# Patient Record
Sex: Female | Born: 1995 | Race: Black or African American | Hispanic: No | Marital: Married | Smoking: Former smoker
Health system: Southern US, Community
[De-identification: ages and names within clinical notes are randomized; demographics above are authoritative.]

## PROBLEM LIST (undated history)

## (undated) ENCOUNTER — Inpatient Hospital Stay: Payer: Self-pay

## (undated) DIAGNOSIS — Z789 Other specified health status: Secondary | ICD-10-CM

---
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
## 2014-09-26 NOTE — L&D Delivery Note (Signed)
Deliver Note   Date of Delivery:   02/20/2015 Primary OB:   [facility] Gestational Age/EDD: 8223w3d by 03/03/2015, by Last Menstrual Period  Antepartum complications:  OB History    Gravida Para Term Preterm AB TAB SAB Ectopic Multiple Living   4 2 2   0 0 0 0 0 2      Obstetric Comments   [facility] as [facility] by patient.  Patient states that [facility] but has not been there x 1 month or more.  [place] and [facility].      Delivered By:   Vena AustriaStaebler, Edwin Cherian MD  Delivery Type:  NSVD   Anesthesia: none      Intrapartum complications:  GBS:     Unknown Laceration:     none Episiotomy:    none Placenta:    Spontaneous Estimated Blood Loss:  500mL Baby:    Liveborn female , APGAR (1 MIN):8 APGAR (5 MINS):  9 APGAR (10 MINS):  , weight pending   Deliver Details   At  a female was delivered via  (Presentation: OA  ).  APGAR:8 ,9 ; weight pending.   Placenta status: delivered spontaneously and intact , 3 vessel Cord:  with the following complications: none with delivery, precipitous delivery no/limited prenatal care    Mom to postpartum.  Baby to Nursery.

## 2015-02-11 ENCOUNTER — Observation Stay
Admission: EM | Admit: 2015-02-11 | Discharge: 2015-02-11 | Disposition: A | Discharge status: Home / Self Care | Payer: Self-pay | Attending: Obstetrics & Gynecology | Admitting: Obstetrics & Gynecology

## 2015-02-11 ENCOUNTER — Encounter: Payer: Self-pay | Admitting: *Deleted

## 2015-02-11 ENCOUNTER — Observation Stay: Payer: Self-pay

## 2015-02-11 DIAGNOSIS — R109 Unspecified abdominal pain: Secondary | ICD-10-CM | POA: Insufficient documentation

## 2015-02-11 DIAGNOSIS — Z3A37 37 weeks gestation of pregnancy: Secondary | ICD-10-CM | POA: Insufficient documentation

## 2015-02-11 DIAGNOSIS — O26893 Other specified pregnancy related conditions, third trimester: Principal | ICD-10-CM | POA: Insufficient documentation

## 2015-02-11 DIAGNOSIS — F1910 Other psychoactive substance abuse, uncomplicated: Secondary | ICD-10-CM

## 2015-02-11 DIAGNOSIS — O0933 Supervision of pregnancy with insufficient antenatal care, third trimester: Secondary | ICD-10-CM

## 2015-02-11 DIAGNOSIS — O0930 Supervision of pregnancy with insufficient antenatal care, unspecified trimester: Secondary | ICD-10-CM

## 2015-02-11 HISTORY — DX: Other psychoactive substance abuse, uncomplicated: F19.10

## 2015-02-11 HISTORY — DX: Other specified health status: Z78.9

## 2015-02-11 LAB — DIFFERENTIAL
BASOS ABS: 0 10*3/uL (ref 0–0.1)
Basophils Relative: 0 %
Eosinophils Absolute: 0.3 10*3/uL (ref 0–0.7)
Eosinophils Relative: 3 %
LYMPHS PCT: 24 %
Lymphs Abs: 2.2 10*3/uL (ref 1.0–3.6)
Monocytes Absolute: 1.1 10*3/uL — ABNORMAL HIGH (ref 0.2–0.9)
Monocytes Relative: 12 %
NEUTROS ABS: 5.7 10*3/uL (ref 1.4–6.5)
Neutrophils Relative %: 61 %

## 2015-02-11 LAB — RAPID HIV SCREEN (HIV 1/2 AB+AG)
HIV 1/2 Antibodies: NONREACTIVE
HIV-1 P24 Antigen - HIV24: NONREACTIVE

## 2015-02-11 LAB — CBC
HEMATOCRIT: 29.2 % — AB (ref 35.0–47.0)
Hemoglobin: 9.7 g/dL — ABNORMAL LOW (ref 12.0–16.0)
MCH: 28.4 pg (ref 26.0–34.0)
MCHC: 33 g/dL (ref 32.0–36.0)
MCV: 86.1 fL (ref 80.0–100.0)
PLATELETS: 203 10*3/uL (ref 150–440)
RBC: 3.4 MIL/uL — AB (ref 3.80–5.20)
RDW: 13.3 % (ref 11.5–14.5)
WBC: 9.4 10*3/uL (ref 3.6–11.0)

## 2015-02-11 LAB — URINALYSIS COMPLETE WITH MICROSCOPIC (ARMC ONLY)
Bilirubin Urine: NEGATIVE
Glucose, UA: NEGATIVE mg/dL
HGB URINE DIPSTICK: NEGATIVE
Ketones, ur: NEGATIVE mg/dL
Nitrite: NEGATIVE
PH: 7 (ref 5.0–8.0)
PROTEIN: NEGATIVE mg/dL
Specific Gravity, Urine: 1.005 (ref 1.005–1.030)

## 2015-02-11 LAB — URINE DRUG SCREEN, QUALITATIVE (ARMC ONLY)
Amphetamines, Ur Screen: NOT DETECTED
Barbiturates, Ur Screen: NOT DETECTED
Benzodiazepine, Ur Scrn: POSITIVE — AB
Cannabinoid 50 Ng, Ur ~~LOC~~: POSITIVE — AB
Cocaine Metabolite,Ur ~~LOC~~: POSITIVE — AB
MDMA (ECSTASY) UR SCREEN: NOT DETECTED
Methadone Scn, Ur: NOT DETECTED
Opiate, Ur Screen: NOT DETECTED
Phencyclidine (PCP) Ur S: NOT DETECTED
Tricyclic, Ur Screen: NOT DETECTED

## 2015-02-11 LAB — TYPE AND SCREEN
ABO/RH(D): A POS
Antibody Screen: NEGATIVE

## 2015-02-11 LAB — ABO/RH: ABO/RH(D): A POS

## 2015-02-11 MED ORDER — NITROFURANTOIN MACROCRYSTAL 100 MG PO CAPS
ORAL_CAPSULE | ORAL | Status: AC
  Filled 2015-02-11: qty 1

## 2015-02-11 MED ORDER — NITROFURANTOIN MONOHYD MACRO 100 MG PO CAPS
100.0000 mg | ORAL_CAPSULE | Freq: Two times a day (BID) | ORAL | Status: DC
  Administered 2015-02-11: 100 mg via ORAL
  Filled 2015-02-11 (×2): qty 1

## 2015-02-11 MED ORDER — NITROFURANTOIN MONOHYD MACRO 100 MG PO CAPS: 100.0000 mg | ORAL_CAPSULE | Freq: Two times a day (BID) | ORAL | Status: AC

## 2015-02-11 NOTE — Progress Notes (Signed)
Entered pt's room. Pt sleeping. Midwife courtney in to ask pt regarding prenatal care. Pt vague with answers. Called [facility]. States pt was only seen in triage x 1. No ultrasound report available. Orders for prenatal labs  And ultrasound entered by courtney.

## 2015-02-11 NOTE — OB Triage Note (Signed)
[facility] with c/o abd pain and pressure into groin and legs x 2 days.  Denies bleeding or leaking of fluid.  States [place] in [place] but has not been back in some time.  States she has recently moved from [place] and is planning to initiate care locally but does not know where.

## 2015-02-11 NOTE — H&P (Signed)
Obstetric History and Physical  Colleen Mccarthy is a 19 y.o. [id number] with IUP at 2035w1d presenting for abdominal pain that started last night. Patient states she has been having  Irregular contractions, none vaginal bleeding, intact membranes, with active fetal movement.    Prenatal Course Source of Care: pt [facility] ( no records found when contacting agencies- only triage visits)   Pt has had limited prenatal care- no records available. Pt reports having an us performed "by a doctor with the small machine" but has not had any formal scans or labs drawn. Pt denies any substance abuse or use. Pt denies any problems during her pregnancy. She is overall a poor historian and cannot remember her due date. [facility] was called and the patient was found to have a LMP of 05/28/15 with an EDC of 03/03/15.   Pregnancy complications or risks: Patient Active Problem List   Diagnosis Date Noted  . Labor and delivery, indication for care 02/11/2015  . Drug abuse 02/11/2015  . Insufficient prenatal care in third trimester 02/11/2015    Prenatal labs and studies:  None drawn  ABO, Rh:   Antibody:   Rubella:   Varicella: unknown RPR:    HBsAg:    HIV:    GBS:  1 hr Glucola  Not performed- no care Genetic screening NA Anatomy US NA- not performed.  Tdap: unknown  Flu: unknown   Past Medical History  Diagnosis Date  . Medical history non-contributory     History reviewed. No pertinent past surgical history.  OB History  Gravida Para Term Preterm AB SAB TAB Ectopic Multiple Living  4 2 2   0 0 0 0 0 2    # Outcome Date GA Lbr Len/2nd Weight Sex Delivery Anes PTL Lv  4 Current           3 Gravida           2 Term           1 Term             [facility].  Patient states [facility] but has not been there x 1 month or more.  [place] and [facility]  yet.    History   Social History  . Marital Status: Single    Spouse Name: N/A  . Number of Children: N/A  . Years of Education: N/A   Social History Main Topics  . Smoking status: Current Every Day Smoker    Types: Cigarettes  . Smokeless tobacco: Not on file  . Alcohol Use: No     Comment: pt denies but has a +UDS  . Drug Use: Yes    Special: Cocaine, Benzodiazepines, Marijuana  . Sexual Activity: Yes    Birth Control/ Protection: None   Other Topics Concern  . None   Social History Narrative  . None    Family History  Problem Relation Age of Onset  . Cancer Paternal Grandmother     Prescriptions prior to admission  Medication Sig Dispense Refill Last Dose  . Prenatal Vit-Fe Fumarate-FA (MULTIVITAMIN-PRENATAL) 27-0.8 MG TABS tablet Take 1 tablet by mouth daily at 12 noon.   02/10/2015 at Unknown time    Allergies  Allergen Reactions  . Vicodin [Hydrocodone-Acetaminophen]     Patient describes seizure like activity with administration.  Happened  7 years ago    Review of Systems: Negative except for what is mentioned in HPI.  Physical Exam: BP 106/64 mmHg  Pulse 72  Temp(Src) 97.8 F (36.6 C) (Oral)  Resp 20  Ht  (1.702 m)  Wt 65.772 kg (145 lb)  BMI 22.71 kg/m2  LMP 05/27/2014 (Within Days) GENERAL: Well-developed, well-nourished female in no acute distress.  LUNGS: Clear to auscultation bilaterally.  HEART: Regular rate and rhythm. ABDOMEN: Soft, nontender, nondistended, gravid. EXTREMITIES: Nontender, no edema, 2+ distal pulses.   Presentation: cephalic FHT:  Baseline rate 125-130s bpm   Variability moderate  Accelerations present   Decelerations none Contractions: Every 10-12 mins  Pertinent Labs/Studies:   Results for orders placed or performed during the hospital encounter of 02/11/15 (from the past 24 hour(s))  Urinalysis complete, with microscopic [facility])     Status: Abnormal   Collection Time: 02/11/15  5:12 AM  Result Value Ref Range    Color, Urine STRAW (A) YELLOW   APPearance CLEAR (A) CLEAR   Glucose, UA NEGATIVE NEGATIVE mg/dL   Bilirubin Urine NEGATIVE NEGATIVE   Ketones, ur NEGATIVE NEGATIVE mg/dL   Specific Gravity, Urine 1.005 1.005 - 1.030   Hgb urine dipstick NEGATIVE NEGATIVE   pH 7.0 5.0 - 8.0   Protein, ur NEGATIVE NEGATIVE mg/dL   Nitrite NEGATIVE NEGATIVE   Leukocytes, UA 2+ (A) NEGATIVE   RBC / HPF 0-5 0 - 5 RBC/hpf   WBC, UA 6-30 0 - 5 WBC/hpf   Bacteria, UA RARE (A) NONE SEEN   Squamous Epithelial / LPF 0-5 (A) NONE SEEN  Urine Drug Screen, Qualitative [facility])     Status: Abnormal   Collection Time: 02/11/15  5:12 AM  Result Value Ref Range   Tricyclic, Ur Screen NONE DETECTED NONE DETECTED   Amphetamines, Ur Screen NONE DETECTED NONE DETECTED   MDMA (Ecstasy)Ur Screen NONE DETECTED NONE DETECTED   Cocaine Metabolite,Ur [place] POSITIVE (A) NONE DETECTED   Opiate, Ur Screen NONE DETECTED NONE DETECTED   Phencyclidine (PCP) Ur S NONE DETECTED NONE DETECTED   Cannabinoid 50 Ng, Ur [place] POSITIVE (A) NONE DETECTED   Barbiturates, Ur Screen NONE DETECTED NONE DETECTED   Benzodiazepine, Ur Scrn POSITIVE (A) NONE DETECTED   Methadone Scn, Ur NONE DETECTED NONE DETECTED   OB History    Gravida Para Term Preterm AB TAB SAB Ectopic Multiple Living   0 0 0 0 0 2      Obstetric Comments   [facility] March 11, 2015 as stated by patient.  Patient states that she was being seen at [facility] but has not been there x 1 month or more.  Patient has recently moved to [facility] and has not set up [facility] yet.     OB limited scan:  CLINICAL DATA: No prenatal care. Pain for 3 days.  EXAM: LIMITED OBSTETRIC ULTRASOUND  FINDINGS: Number of Fetuses: 1 Heart Rate: 140 bpm Movement: Yes Presentation: Cephalic Placental Location: Fundal, posterior Previa: No Amniotic Fluid (Subjective): Within normal limits.  AFI: 10.5 cm BPD: 8.74cm 35w 2d MATERNAL FINDINGS: Cervix: Not visualize. Uterus/Adnexae:  No abnormality visualized.  IMPRESSION: 1. Single living intrauterine gestation. The estimated gestational age by today's exam is 35 weeks and 2 days. The clinical gestational age is 37 weeks and 1 day.  This exam is performed on an emergent basis and does not comprehensively evaluate fetal size, dating, or anatomy; follow-up complete OB US should be considered if further fetal assessment is warranted.  Assessment : Colleen Mccarthy is a 19 y.o. [phone] at 7232w1d being observed for abdominal pain UTI No prenatal care +UDS  Cat 1 FHT   Plan: Treat UTI- rx for macrobid when pt goes home US for position, AFI, dates Prenatal labs including GBS to be drawn  Discharge home with labor precautions   Jannet Mantisourtney Subudhi, CNM [facility]  Cervix on exam by L&D staff/CNM Subudhi was closed. Ranae Plumberhelsea Serra Younan, MD

## 2015-02-11 NOTE — Plan of Care (Signed)
[facility]

## 2015-02-12 LAB — RPR: RPR: NONREACTIVE

## 2015-02-12 LAB — VARICELLA ZOSTER ANTIBODY, IGG

## 2015-02-12 LAB — RUBELLA SCREEN: Rubella: 2.13 index (ref >0.99)

## 2015-02-13 LAB — CULTURE, BETA STREP (GROUP B ONLY)

## 2015-02-13 LAB — MISC LABCORP TEST (SEND OUT): Labcorp test code: 6510

## 2015-02-15 LAB — CHLAMYDIA/NGC RT PCR (ARMC ONLY)
Chlamydia Tr: DETECTED
N gonorrhoeae: NOT DETECTED

## 2015-02-20 ENCOUNTER — Inpatient Hospital Stay
Admission: EM | Admit: 2015-02-20 | Discharge: 2015-02-22 | DRG: 775 | Disposition: A | Discharge status: Home / Self Care | Payer: Self-pay | Attending: Obstetrics and Gynecology | Admitting: Obstetrics and Gynecology

## 2015-02-20 DIAGNOSIS — Z3490 Encounter for supervision of normal pregnancy, unspecified, unspecified trimester: Secondary | ICD-10-CM

## 2015-02-20 DIAGNOSIS — F17210 Nicotine dependence, cigarettes, uncomplicated: Secondary | ICD-10-CM | POA: Diagnosis present

## 2015-02-20 DIAGNOSIS — Z3A38 38 weeks gestation of pregnancy: Secondary | ICD-10-CM | POA: Diagnosis present

## 2015-02-20 DIAGNOSIS — O0933 Supervision of pregnancy with insufficient antenatal care, third trimester: Secondary | ICD-10-CM

## 2015-02-20 DIAGNOSIS — O99334 Smoking (tobacco) complicating childbirth: Secondary | ICD-10-CM | POA: Diagnosis present

## 2015-02-20 DIAGNOSIS — Z3A00 Weeks of gestation of pregnancy not specified: Secondary | ICD-10-CM | POA: Diagnosis present

## 2015-02-20 DIAGNOSIS — Z809 Family history of malignant neoplasm, unspecified: Secondary | ICD-10-CM

## 2015-02-20 DIAGNOSIS — F1410 Cocaine abuse, uncomplicated: Secondary | ICD-10-CM | POA: Diagnosis present

## 2015-02-20 DIAGNOSIS — O99324 Drug use complicating childbirth: Principal | ICD-10-CM | POA: Diagnosis present

## 2015-02-20 LAB — CBC
HEMATOCRIT: 34.9 % — AB (ref 35.0–47.0)
Hemoglobin: 11.4 g/dL — ABNORMAL LOW (ref 12.0–16.0)
MCH: 28.5 pg (ref 26.0–34.0)
MCHC: 32.6 g/dL (ref 32.0–36.0)
MCV: 87.4 fL (ref 80.0–100.0)
Platelets: 213 10*3/uL (ref 150–440)
RBC: 3.99 MIL/uL (ref 3.80–5.20)
RDW: 13.5 % (ref 11.5–14.5)
WBC: 12.3 10*3/uL — AB (ref 3.6–11.0)

## 2015-02-20 LAB — URINE DRUG SCREEN, QUALITATIVE (ARMC ONLY)
Amphetamines, Ur Screen: NOT DETECTED
BARBITURATES, UR SCREEN: NOT DETECTED
BENZODIAZEPINE, UR SCRN: NOT DETECTED
COCAINE METABOLITE, UR ~~LOC~~: POSITIVE — AB
Cannabinoid 50 Ng, Ur ~~LOC~~: POSITIVE — AB
MDMA (Ecstasy)Ur Screen: NOT DETECTED
METHADONE SCREEN, URINE: NOT DETECTED
Opiate, Ur Screen: NOT DETECTED
Phencyclidine (PCP) Ur S: NOT DETECTED
Tricyclic, Ur Screen: NOT DETECTED

## 2015-02-20 LAB — TYPE AND SCREEN
ABO/RH(D): A POS
ANTIBODY SCREEN: NEGATIVE

## 2015-02-20 MED ORDER — IBUPROFEN 600 MG PO TABS
600.0000 mg | ORAL_TABLET | Freq: Four times a day (QID) | ORAL | Status: DC
  Administered 2015-02-20 – 2015-02-22 (×8): 600 mg via ORAL
  Filled 2015-02-20 (×6): qty 1

## 2015-02-20 MED ORDER — OXYTOCIN 40 UNITS IN LACTATED RINGERS INFUSION - SIMPLE MED
INTRAVENOUS | Status: AC
  Filled 2015-02-20: qty 1000

## 2015-02-20 MED ORDER — TETANUS-DIPHTH-ACELL PERTUSSIS 5-2.5-18.5 LF-MCG/0.5 IM SUSP
0.5000 mL | Freq: Once | INTRAMUSCULAR | Status: AC
  Administered 2015-02-22: 0.5 mL via INTRAMUSCULAR
  Filled 2015-02-20: qty 0.5

## 2015-02-20 MED ORDER — LANOLIN HYDROUS EX OINT: TOPICAL_OINTMENT | CUTANEOUS | Status: DC | PRN

## 2015-02-20 MED ORDER — SIMETHICONE 80 MG PO CHEW: 80.0000 mg | CHEWABLE_TABLET | ORAL | Status: DC | PRN

## 2015-02-20 MED ORDER — BENZOCAINE-MENTHOL 20-0.5 % EX AERO
1.0000 "application " | INHALATION_SPRAY | CUTANEOUS | Status: DC | PRN
  Administered 2015-02-21: 1 via TOPICAL
  Filled 2015-02-20: qty 56

## 2015-02-20 MED ORDER — ONDANSETRON HCL 4 MG PO TABS: 4.0000 mg | ORAL_TABLET | ORAL | Status: DC | PRN

## 2015-02-20 MED ORDER — AZITHROMYCIN 250 MG PO TABS
1000.0000 mg | ORAL_TABLET | Freq: Once | ORAL | Status: AC
  Administered 2015-02-20: 1000 mg via ORAL
  Filled 2015-02-20: qty 4

## 2015-02-20 MED ORDER — IBUPROFEN 600 MG PO TABS
ORAL_TABLET | ORAL | Status: AC
  Administered 2015-02-20: 600 mg via ORAL
  Filled 2015-02-20: qty 1

## 2015-02-20 MED ORDER — LIDOCAINE HCL (PF) 1 % IJ SOLN
30.0000 mL | INTRAMUSCULAR | Status: DC | PRN
  Filled 2015-02-20: qty 30

## 2015-02-20 MED ORDER — ONDANSETRON HCL 4 MG/2ML IJ SOLN: 4.0000 mg | INTRAMUSCULAR | Status: DC | PRN

## 2015-02-20 MED ORDER — LACTATED RINGERS IV SOLN: 500.0000 mL | INTRAVENOUS | Status: DC | PRN

## 2015-02-20 MED ORDER — SENNOSIDES-DOCUSATE SODIUM 8.6-50 MG PO TABS
2.0000 | ORAL_TABLET | ORAL | Status: DC
  Administered 2015-02-21: 2 via ORAL
  Filled 2015-02-20 (×2): qty 2

## 2015-02-20 MED ORDER — OXYTOCIN 40 UNITS IN LACTATED RINGERS INFUSION - SIMPLE MED: 62.5000 mL/h | INTRAVENOUS | Status: DC

## 2015-02-20 MED ORDER — PRENATAL MULTIVITAMIN CH
1.0000 | ORAL_TABLET | Freq: Every day | ORAL | Status: DC
  Administered 2015-02-21 – 2015-02-22 (×2): 1 via ORAL
  Filled 2015-02-20 (×2): qty 1

## 2015-02-20 MED ORDER — DIPHENHYDRAMINE HCL 25 MG PO CAPS
25.0000 mg | ORAL_CAPSULE | Freq: Four times a day (QID) | ORAL | Status: DC | PRN
  Administered 2015-02-21: 25 mg via ORAL
  Filled 2015-02-20: qty 1

## 2015-02-20 MED ORDER — LACTATED RINGERS IV SOLN: INTRAVENOUS | Status: DC

## 2015-02-20 MED ORDER — OXYTOCIN BOLUS FROM INFUSION
500.0000 mL | INTRAVENOUS | Status: DC
  Administered 2015-02-20: 500 mL via INTRAVENOUS

## 2015-02-20 MED ORDER — DIBUCAINE 1 % RE OINT: 1.0000 "application " | TOPICAL_OINTMENT | RECTAL | Status: DC | PRN

## 2015-02-20 MED ORDER — WITCH HAZEL-GLYCERIN EX PADS: 1.0000 "application " | MEDICATED_PAD | CUTANEOUS | Status: DC | PRN

## 2015-02-20 NOTE — H&P (Addendum)
Obstetric History and Physical  Colleen Mccarthy is a 19 y.o. [id number] with IUP at [redacted]w[redacted]d presenting via EMS for contractions starting yesterday evening.  Precipitously delivered shortly after arrival.    Prenatal Course Source of Care: pt states [facility] is with [facility] ( no records found when contacting agencies- only triage visits)   Pt has had limited prenatal care- no records available. Pt reports having an US performed "by a doctor with the small machine" but has not had any formal scans or labs drawn. Pt denies any substance abuse or use. Pt denies any problems during her pregnancy. She is overall a poor historian and cannot remember her due date. [facility] was called and the patient was found to have a LMP of 05/28/15 with an EDC of 03/03/15.   Pregnancy complications or risks: Patient Active Problem List   Diagnosis Date Noted  . Pregnant 02/20/2015  . Labor and delivery, indication for care 02/11/2015  . Drug abuse 02/11/2015  . Insufficient prenatal care in third trimester 02/11/2015    Prenatal labs and studies:  None drawn  ABO, Rh: --/--/A POS, A POS (05/18 1040) Antibody: NEG (05/18 1040) Rubella: 2.13 (05/18 1040) Varicella: unknown RPR: Non Reactive (05/18 1040)  HBsAg:    HIV:    GBS:  1 hr Glucola  Not performed- no care Genetic screening NA Anatomy US NA- not performed.  Tdap: unknown  Flu: unknown   Past Medical History  Diagnosis Date  . Medical history non-contributory     No past surgical history on file.  OB History  Gravida Para Term Preterm AB SAB TAB Ectopic Multiple Living  0 0 0 0 0 2    # Outcome Date GA Lbr Len/2nd Weight Sex Delivery Anes PTL Lv  4 Current           3 Gravida           2 Term           1 Term             Obstetric Comments  [facility] March 11, 2015 as stated by patient.  Patient states that she was being seen at [facility] but has not been there x 1 month or more.  Patient has recently moved to [facility] and has  not set up [facility] yet.    History   Social History  . Marital Status: Single    Spouse Name: N/A  . Number of Children: N/A  . Years of Education: N/A   Social History Main Topics  . Smoking status: Current Every Day Smoker    Types: Cigarettes  . Smokeless tobacco: Not on file  . Alcohol Use: No     Comment: pt denies but has a +UDS  . Drug Use: Yes    Special: Cocaine, Benzodiazepines, Marijuana  . Sexual Activity: Yes    Birth Control/ Protection: None   Other Topics Concern  . Not on file   Social History Narrative  . No narrative on file    Family History  Problem Relation Age of Onset  . Cancer Paternal Grandmother     Prescriptions prior to admission  Medication Sig Dispense Refill Last Dose  . Prenatal Vit-Fe Fumarate-FA (MULTIVITAMIN-PRENATAL) 27-0.8 MG TABS tablet Take 1 tablet by mouth daily at 12 noon.   02/10/2015 at Unknown time    Allergies  Allergen Reactions  . Vicodin [Hydrocodone-Acetaminophen]     Patient describes seizure like activity  with administration.  Happened 7 years ago    Review of Systems: Negative except for what is mentioned in HPI.  Physical Exam: LMP 05/27/2014 (Within Days) GENERAL: Well-developed, well-nourished female in no acute distress.  LUNGS: Clear to auscultation bilaterally.  HEART: Regular rate and rhythm. ABDOMEN: Soft, nontender, nondistended, gravid. Cervix 8/C/-2 on presentation, AROM clear fluid EXTREMITIES: Nontender, no edema, 2+ distal pulses.   Presentation: cephalic (OA) FHT:  Baseline rate 140s bpm   Variability moderate  Accelerations present   Decelerations none Contractions: Every 2-3 min  Pertinent Labs/Studies:   No results found for this or any previous visit (from the past 24 hour(s)). OB History    Gravida Para Term Preterm AB TAB SAB Ectopic Multiple Living   4 2 2   0 0 0 0 0 2      Obstetric Comments   [facility].  Patient states [facility]  but has not been there x 1 month or more.  [place] and [facility].     Assessment : Colleen Mccarthy is a 19 y.o. [phone] at 8933w1d being observed for abdominal pain  Plan: - admit for postpartum care - UDS (cocaine use on 5/18) - GBS culture - Chlamydia positive previously will verify treated  GBS was positive 5/18, also Chlamydia positive 5/18 not treated will give azithromycin now, peds aware

## 2015-02-20 NOTE — Progress Notes (Signed)
Dr Vergie Livingpickens given update- [facility], and for RN to observe BP.

## 2015-02-20 NOTE — Progress Notes (Signed)
Reflexes 3+, 0 clonus. Pt reports nil h/a or visual disturbances. Edema 1+ bipedally only.

## 2015-02-20 NOTE — Progress Notes (Signed)
Patient arrived via EMS stating she needed to push. Patient is out of control screaming. Pt has had little PNC and states she has used drugs this pregnancy. Patient denies any problems this pregnancy  Or medical history.

## 2015-02-21 LAB — RPR: RPR Ser Ql: NONREACTIVE

## 2015-02-21 LAB — CBC
HCT: 27.6 % — ABNORMAL LOW (ref 35.0–47.0)
HEMOGLOBIN: 9.2 g/dL — AB (ref 12.0–16.0)
MCH: 29 pg (ref 26.0–34.0)
MCHC: 33.5 g/dL (ref 32.0–36.0)
MCV: 86.7 fL (ref 80.0–100.0)
Platelets: 189 10*3/uL (ref 150–440)
RBC: 3.18 MIL/uL — ABNORMAL LOW (ref 3.80–5.20)
RDW: 13.4 % (ref 11.5–14.5)
WBC: 13.1 10*3/uL — AB (ref 3.6–11.0)

## 2015-02-21 MED ORDER — VARICELLA VIRUS VACCINE LIVE 1350 PFU/0.5ML IJ SUSR
0.5000 mL | Freq: Once | INTRAMUSCULAR | Status: AC
  Administered 2015-02-22: 0.5 mL via SUBCUTANEOUS
  Filled 2015-02-21: qty 0.5

## 2015-02-21 MED ORDER — OXYCODONE-ACETAMINOPHEN 5-325 MG PO TABS
1.0000 | ORAL_TABLET | ORAL | Status: DC | PRN
  Administered 2015-02-21: 1 via ORAL
  Administered 2015-02-22 (×5): 2 via ORAL
  Filled 2015-02-21: qty 1
  Filled 2015-02-21 (×5): qty 2

## 2015-02-21 NOTE — Progress Notes (Signed)
Post Partum Day 1 Subjective: no complaints  Objective: Blood pressure 108/74, pulse 83, temperature 97.9 F (36.6 C), temperature source Oral, resp. rate 16, height 5\' 7"  (1.702 m), weight 65.772 kg (145 lb), last menstrual period 05/27/2014, SpO2 100 %, unknown if currently breastfeeding.  Physical Exam:  General: alert, cooperative and appears stated age Lochia: appropriate Uterine Fundus: firm Incision: none DVT Evaluation: No evidence of DVT seen on physical exam.   Recent Labs  02/20/15 1557 02/21/15 0448  HGB 11.4* 9.2*  HCT 34.9* 27.6*    Assessment/Plan: Plan for discharge tomorrow  Bottle feeding Needs Varicella    LOS: 1 day   Webb Weed PAUL 02/21/2015, 2:21 PM

## 2015-02-22 MED ORDER — MEDROXYPROGESTERONE ACETATE 150 MG/ML IM SUSP
150.0000 mg | Freq: Once | INTRAMUSCULAR | Status: AC
  Administered 2015-02-22: 150 mg via INTRAMUSCULAR
  Filled 2015-02-22: qty 1

## 2015-02-22 MED ORDER — IBUPROFEN 600 MG PO TABS: 600.0000 mg | ORAL_TABLET | Freq: Four times a day (QID) | ORAL | Status: DC

## 2015-02-22 MED ORDER — MEDROXYPROGESTERONE ACETATE 150 MG/ML IM SUSP: 150.0000 mg | Freq: Once | INTRAMUSCULAR | Status: DC

## 2015-02-22 NOTE — Discharge Summary (Signed)
  Obstetrical Discharge Summary  Date of Admission: 02/20/2015 Date of Discharge: 02/22/15  Primary OB:  other facility   Gestational Age at Delivery: 59110w3d  Antepartum complications: none Date of Delivery: 02/20/15   Delivered By: Dr Bonney AidStaebler Delivery Type: spontaneous vaginal delivery Intrapartum complications/course: Limited Prenatal Care, Drug Use Anesthesia: none Placenta: spontaneous Laceration: n/a and none Episiotomy: none Baby: Liveborn female  Post partum course: Since the delivery, patient has tolerate activity, diet, and daily functions without difficulty or complication.  Min lochia.  No breast concerns at this time.  No signs of depression currently.   Disposition: home with infant Rh Immune globulin given: no Rubella vaccine given: no Varicella vaccine given: yes Tdap vaccine given in AP or PP setting: yes Flu vaccine given in AP or PP setting: not applicable Contraception: Depo-Provera  Prenatal Labs: A POS//plans to bottle feed//female  Plan:  Colleen Mccarthy was discharged to home in good condition. [facility] in 6 weeks  Discharge Medications:   Medication List    TAKE these medications        ibuprofen 600 MG tablet  Commonly known as:  ADVIL,MOTRIN  Take 1 tablet (600 mg total) by mouth every 6 (six) hours.     multivitamin-prenatal 27-0.8 MG Tabs tablet  Take 1 tablet by mouth daily at 12 noon.

## 2015-02-22 NOTE — Discharge Instructions (Signed)
Postpartum Care After Vaginal Delivery °After you deliver your newborn (postpartum period), the usual stay in the hospital is 24-72 hours. If there were problems with your labor or delivery, or if you have other medical problems, you might be in the hospital longer.  °While you are in the hospital, you will receive help and instructions on how to care for yourself and your newborn during the postpartum period.  °While you are in the hospital: °· Be sure to tell your nurses if you have pain or discomfort, as well as where you feel the pain and what makes the pain worse. °· If you had an incision made near your vagina (episiotomy) or if you had some tearing during delivery, the nurses may put ice packs on your episiotomy or tear. The ice packs may help to reduce the pain and swelling. °· If you are breastfeeding, you may feel uncomfortable contractions of your uterus for a couple of weeks. This is normal. The contractions help your uterus get back to normal size. °· It is normal to have some bleeding after delivery. °¨ For the first 1-3 days after delivery, the flow is red and the amount may be similar to a period. °¨ It is common for the flow to start and stop. °¨ In the first few days, you may pass some small clots. Let your nurses know if you begin to pass large clots or your flow increases. °¨ Do not  flush blood clots down the toilet before having the nurse look at them. °¨ During the next 3-10 days after delivery, your flow should become more watery and pink or brown-tinged in color. °¨ Ten to fourteen days after delivery, your flow should be a small amount of yellowish-white discharge. °¨ The amount of your flow will decrease over the first few weeks after delivery. Your flow may stop in 6-8 weeks. Most women have had their flow stop by 12 weeks after delivery. °· You should change your sanitary pads frequently. °· Wash your hands thoroughly with soap and water for at least 20 seconds after changing pads, using  the toilet, or before holding or feeding your newborn. °· You should feel like you need to empty your bladder within the first 6-8 hours after delivery. °· In case you become weak, lightheaded, or faint, call your nurse before you get out of bed for the first time and before you take a shower for the first time. °· Within the first few days after delivery, your breasts may begin to feel tender and full. This is called engorgement. Breast tenderness usually goes away within 48-72 hours after engorgement occurs. You may also notice milk leaking from your breasts. If you are not breastfeeding, do not stimulate your breasts. Breast stimulation can make your breasts produce more milk. °· Spending as much time as possible with your newborn is very important. During this time, you and your newborn can feel close and get to know each other. Having your newborn stay in your room (rooming in) will help to strengthen the bond with your newborn.  It will give you time to get to know your newborn and become comfortable caring for your newborn. °· Your hormones change after delivery. Sometimes the hormone changes can temporarily cause you to feel sad or tearful. These feelings should not last more than a few days. If these feelings last longer than that, you should talk to your caregiver. °· If desired, talk to your caregiver about methods of family planning or contraception. °·   Talk to your caregiver about immunizations. Your caregiver may want you to have the following immunizations before leaving the hospital: °¨ Tetanus, diphtheria, and pertussis (Tdap) or tetanus and diphtheria (Td) immunization. It is very important that you and your family (including grandparents) or others caring for your newborn are up-to-date with the Tdap or Td immunizations. The Tdap or Td immunization can help protect your newborn from getting ill. °¨ Rubella immunization. °¨ Varicella (chickenpox) immunization. °¨ Influenza immunization. You should  receive this annual immunization if you did not receive the immunization during your pregnancy. °Document Released: 07/10/2007 Document Revised: 06/06/2012 Document Reviewed: 05/09/2012 °ExitCare® Patient Information ©2015 [employer]. This information is not intended to replace advice given to you by your health care provider. Make sure you discuss any questions you have with your health care provider. ° °Call your doctor for increased pain or vaginal bleeding, temperature above 100.4, depression, or concerns.  No strenuous activity or heavy lifting for 6 weeks.  No intercourse, tampons, douching, or enemas for 6 weeks.  No tub baths-showers only.  No driving for 2 weeks or while taking pain medications.  Continue prenatal vitamin and iron.   °

## 2015-02-22 NOTE — Progress Notes (Signed)
Post Partum Day 1 Subjective: no complaints  Objective: Blood pressure 107/74, pulse 67, temperature 98.5 F (36.9 C), temperature source Oral, resp. rate 16, height 5\' 7"  (1.702 m), weight 65.772 kg (145 lb), last menstrual period 05/27/2014, SpO2 100 %, unknown if currently breastfeeding.  Physical Exam:  General: alert, cooperative and appears stated age Lochia: appropriate Uterine Fundus: firm Incision: none DVT Evaluation: No evidence of DVT seen on physical exam.   Recent Labs  02/20/15 1557 02/21/15 0448  HGB 11.4* 9.2*  HCT 34.9* 27.6*    Assessment/Plan: Plan for  tomorrow  Bottle feeding Needs Varicella, TDaP Depo Provera today    LOS: 2 days   Marcie Shearon PAUL 02/22/2015, 11:46 AM

## 2015-07-03 ENCOUNTER — Emergency Department: Payer: Medicaid Other

## 2015-07-03 ENCOUNTER — Encounter: Payer: Self-pay | Admitting: *Deleted

## 2015-07-03 ENCOUNTER — Emergency Department
Admission: EM | Admit: 2015-07-03 | Discharge: 2015-07-03 | Disposition: A | Discharge status: Home / Self Care | Payer: Medicaid Other | Attending: Emergency Medicine | Admitting: Emergency Medicine

## 2015-07-03 DIAGNOSIS — Z720 Tobacco use: Secondary | ICD-10-CM | POA: Insufficient documentation

## 2015-07-03 DIAGNOSIS — Y9289 Other specified places as the place of occurrence of the external cause: Secondary | ICD-10-CM | POA: Diagnosis not present

## 2015-07-03 DIAGNOSIS — Y998 Other external cause status: Secondary | ICD-10-CM | POA: Diagnosis not present

## 2015-07-03 DIAGNOSIS — W540XXA Bitten by dog, initial encounter: Secondary | ICD-10-CM | POA: Insufficient documentation

## 2015-07-03 DIAGNOSIS — S91012A Laceration without foreign body, left ankle, initial encounter: Secondary | ICD-10-CM | POA: Insufficient documentation

## 2015-07-03 DIAGNOSIS — S91312A Laceration without foreign body, left foot, initial encounter: Secondary | ICD-10-CM | POA: Diagnosis not present

## 2015-07-03 DIAGNOSIS — S91052A Open bite, left ankle, initial encounter: Secondary | ICD-10-CM | POA: Insufficient documentation

## 2015-07-03 DIAGNOSIS — Y9389 Activity, other specified: Secondary | ICD-10-CM | POA: Insufficient documentation

## 2015-07-03 DIAGNOSIS — S91352A Open bite, left foot, initial encounter: Secondary | ICD-10-CM | POA: Diagnosis not present

## 2015-07-03 DIAGNOSIS — IMO0002 Reserved for concepts with insufficient information to code with codable children: Secondary | ICD-10-CM

## 2015-07-03 MED ORDER — ONDANSETRON HCL 4 MG/2ML IJ SOLN: 4.0000 mg | Freq: Once | INTRAMUSCULAR | Status: AC

## 2015-07-03 MED ORDER — OXYCODONE-ACETAMINOPHEN 5-325 MG PO TABS
2.0000 | ORAL_TABLET | Freq: Once | ORAL | Status: AC
  Administered 2015-07-03: 2 via ORAL

## 2015-07-03 MED ORDER — OXYCODONE-ACETAMINOPHEN 5-325 MG PO TABS: 2.0000 | ORAL_TABLET | Freq: Four times a day (QID) | ORAL | Status: DC | PRN

## 2015-07-03 MED ORDER — OXYCODONE-ACETAMINOPHEN 5-325 MG PO TABS
ORAL_TABLET | ORAL | Status: AC
  Administered 2015-07-03: 2 via ORAL
  Filled 2015-07-03: qty 2

## 2015-07-03 MED ORDER — MUPIROCIN CALCIUM 2 % EX CREA: TOPICAL_CREAM | CUTANEOUS | Status: AC

## 2015-07-03 MED ORDER — ONDANSETRON HCL 4 MG/2ML IJ SOLN
INTRAMUSCULAR | Status: AC
  Filled 2015-07-03: qty 2

## 2015-07-03 MED ORDER — HYDROMORPHONE HCL 1 MG/ML IJ SOLN
1.0000 mg | Freq: Once | INTRAMUSCULAR | Status: AC
  Administered 2015-07-03: 1 mg via INTRAVENOUS

## 2015-07-03 MED ORDER — HYDROMORPHONE HCL 1 MG/ML IJ SOLN
INTRAMUSCULAR | Status: AC
  Administered 2015-07-03: 1 mg via INTRAVENOUS
  Filled 2015-07-03: qty 1

## 2015-07-03 MED ORDER — CEFAZOLIN SODIUM 1-5 GM-% IV SOLN
1.0000 g | Freq: Once | INTRAVENOUS | Status: AC
  Administered 2015-07-03: 1 g via INTRAVENOUS
  Filled 2015-07-03: qty 50

## 2015-07-03 MED ORDER — CLINDAMYCIN HCL 300 MG PO CAPS: 300.0000 mg | ORAL_CAPSULE | Freq: Three times a day (TID) | ORAL | Status: DC

## 2015-07-03 MED ORDER — MORPHINE SULFATE (PF) 4 MG/ML IV SOLN
4.0000 mg | Freq: Once | INTRAVENOUS | Status: AC
  Administered 2015-07-03: 4 mg via INTRAVENOUS

## 2015-07-03 MED ORDER — LIDOCAINE-EPINEPHRINE (PF) 1 %-1:200000 IJ SOLN
INTRAMUSCULAR | Status: AC
  Administered 2015-07-03: 16:00:00
  Filled 2015-07-03: qty 30

## 2015-07-03 MED ORDER — MORPHINE SULFATE (PF) 4 MG/ML IV SOLN
INTRAVENOUS | Status: AC
  Administered 2015-07-03: 4 mg via INTRAVENOUS
  Filled 2015-07-03: qty 1

## 2015-07-03 NOTE — ED Notes (Addendum)
Report given to Rella Larve at [facility] who stated that dog has been quarantined and rabies shots were up to date.

## 2015-07-03 NOTE — Discharge Instructions (Signed)
Laceration Care, Adult °A laceration is a cut that goes through all of the layers of the skin and into the tissue that is right under the skin. Some lacerations heal on their own. Others need to be closed with stitches (sutures), staples, skin adhesive strips, or skin glue. Proper laceration care minimizes the risk of infection and helps the laceration to heal better. °HOW TO CARE FOR YOUR LACERATION °If sutures or staples were used: °· Keep the wound clean and dry. °· If you were given a bandage (dressing), you should change it at least one time per day or as told by your health care provider. You should also change it if it becomes wet or dirty. °· Keep the wound completely dry for the first 24 hours or as told by your health care provider. After that time, you may shower or bathe. However, make sure that the wound is not soaked in water until after the sutures or staples have been removed. °· Clean the wound one time each day or as told by your health care provider: °¨ Wash the wound with soap and water. °¨ Rinse the wound with water to remove all soap. °¨ Pat the wound dry with a clean towel. Do not rub the wound. °· After cleaning the wound, apply a thin layer of antibiotic ointment as told by your health care provider. This will help to prevent infection and keep the dressing from sticking to the wound. °· Have the sutures or staples removed as told by your health care provider. °If skin adhesive strips were used: °· Keep the wound clean and dry. °· If you were given a bandage (dressing), you should change it at least one time per day or as told by your health care provider. You should also change it if it becomes dirty or wet. °· Do not get the skin adhesive strips wet. You may shower or bathe, but be careful to keep the wound dry. °· If the wound gets wet, pat it dry with a clean towel. Do not rub the wound. °· Skin adhesive strips fall off on their own. You may trim the strips as the wound heals. Do not  remove skin adhesive strips that are still stuck to the wound. They will fall off in time. °If skin glue was used: °· Try to keep the wound dry, but you may briefly wet it in the shower or bath. Do not soak the wound in water, such as by swimming. °· After you have showered or bathed, gently pat the wound dry with a clean towel. Do not rub the wound. °· Do not do any activities that will make you sweat heavily until the skin glue has fallen off on its own. °· Do not apply liquid, cream, or ointment medicine to the wound while the skin glue is in place. Using those may loosen the film before the wound has healed. °· If you were given a bandage (dressing), you should change it at least one time per day or as told by your health care provider. You should also change it if it becomes dirty or wet. °· If a dressing is placed over the wound, be careful not to apply tape directly over the skin glue. Doing that may cause the glue to be pulled off before the wound has healed. °· Do not pick at the glue. The skin glue usually remains in place for 5-10 days, then it falls off of the skin. °General Instructions °· Take over-the-counter and prescription   medicines only as told by your health care provider. °· If you were prescribed an antibiotic medicine or ointment, take or apply it as told by your doctor. Do not stop using it even if your condition improves. °· To help prevent scarring, make sure to cover your wound with sunscreen whenever you are outside after stitches are removed, after adhesive strips are removed, or when glue remains in place and the wound is healed. Make sure to wear a sunscreen of at least 30 SPF. °· Do not scratch or pick at the wound. °· Keep all follow-up visits as told by your health care provider. This is important. °· Check your wound every day for signs of infection. Watch for: °¨ Redness, swelling, or pain. °¨ Fluid, blood, or pus. °· Raise (elevate) the injured area above the level of your heart  while you are sitting or lying down, if possible. °SEEK MEDICAL CARE IF: °· You received a tetanus shot and you have swelling, severe pain, redness, or bleeding at the injection site. °· You have a fever. °· A wound that was closed breaks open. °· You notice a bad smell coming from your wound or your dressing. °· You notice something coming out of the wound, such as wood or glass. °· Your pain is not controlled with medicine. °· You have increased redness, swelling, or pain at the site of your wound. °· You have fluid, blood, or pus coming from your wound. °· You notice a change in the color of your skin near your wound. °· You need to change the dressing frequently due to fluid, blood, or pus draining from the wound. °· You develop a new rash. °· You develop numbness around the wound. °SEEK IMMEDIATE MEDICAL CARE IF: °· You develop severe swelling around the wound. °· Your pain suddenly increases and is severe. °· You develop painful lumps near the wound or on skin that is anywhere on your body. °· You have a red streak going away from your wound. °· The wound is on your hand or foot and you cannot properly move a finger or toe. °· The wound is on your hand or foot and you notice that your fingers or toes look pale or bluish. °  °This information is not intended to replace advice given to you by your health care provider. Make sure you discuss any questions you have with your health care provider. °  °Document Released: 09/12/2005 Document Revised: 01/27/2015 Document Reviewed: 09/08/2014 °Elsevier Interactive Patient Education ©2016 [employer] °Animal Bite °Animal bites can range from mild to serious. An animal bite can result in a scratch on the skin, a deep open cut, a puncture of the skin, a crush injury, or tearing away of the skin or a body part. A small bite from a house pet will usually not cause serious problems. However, some animal bites can become infected or injure a bone or other tissue.  °Bites  from certain animals can be more dangerous because of the risk of spreading rabies, which is a serious viral infection. This risk is higher with bites from stray animals or wild animals, such as raccoons, foxes, skunks, and bats. Dogs are responsible for most animal bites. Children are bitten more often than adults. °SYMPTOMS  °Common symptoms of an animal bite include:  °· Pain.   °· Bleeding.   °· Swelling.   °· Bruising.   °DIAGNOSIS  °This condition may be diagnosed based on a physical exam and medical history. Your health care provider will   examine the wound and ask for details about the animal and how the bite happened. You may also have tests, such as:  °· Blood tests to check for infection or to determine if surgery is needed. °· X-rays to check for damage to bones or joints. °· Culture test. This uses a sample of fluid from the wound to check for infection. °TREATMENT  °Treatment varies depending on the location and type of animal bite and your medical history. Treatment may include:  °· Wound care. This often includes cleaning the wound, flushing the wound with saline solution, and applying a bandage (dressing). Sometimes, the wound is left open to heal because of the high risk of infection. However, in some cases, the wound may be closed with stitches (sutures), staples, skin glue, or adhesive strips.   °· Antibiotic medicine.   °· Tetanus shot.   °· Rabies treatment if the animal could have rabies.   °In some cases, bites that have become infected may require IV antibiotics and surgical treatment in the hospital.  °HOME CARE INSTRUCTIONS °Wound Care  °· Follow instructions from your health care provider about how to take care of your wound. Make sure you: °¨ Wash your hands with soap and water before you change your dressing. If soap and water are not available, use hand sanitizer. °¨ Change your dressing as told by your health care provider. °¨ Leave sutures, skin glue, or adhesive strips in place.  These skin closures may need to be in place for 2 weeks or longer. If adhesive strip edges start to loosen and curl up, you may trim the loose edges. Do not remove adhesive strips completely unless your health care provider tells you to do that. °· Check your wound every day for signs of infection. Watch for:   °¨  Increasing redness, swelling, or pain.   °¨  Fluid, blood, or pus.   °General Instructions  °· Take or apply over-the-counter and prescription medicines only as told by your health care provider.   °· If you were prescribed an antibiotic, take or apply it as told by your health care provider. Do not stop using the antibiotic even if your condition improves.   °· Keep the injured area raised (elevated) above the level of your heart while you are sitting or lying down, if this is possible.   °· If directed, apply ice to the injured area.   °·  Put ice in a plastic bag.   °·  Place a towel between your skin and the bag.   °·  Leave the ice on for 20 minutes, 2-3 times per day.   °· Keep all follow-up visits as told by your health care provider. This is important.   °SEEK MEDICAL CARE IF: °· You have increasing redness, swelling, or pain at the site of your wound.   °· You have a general feeling of sickness (malaise).   °· You feel nauseous or you vomit.   °· You have pain that does not get better.   °SEEK IMMEDIATE MEDICAL CARE IF: °· You have a red streak extending away from your wound.   °· You have fluid, blood, or pus coming from your wound.   °· You have a fever or chills.   °· You have trouble moving your injured area.   °· You have numbness or tingling extending beyond the wound. °  °This information is not intended to replace advice given to you by your health care provider. Make sure you discuss any questions you have with your health care provider. °  °Document Released: 05/31/2011 Document Revised: 06/03/2015 Document Reviewed: 01/28/2015 °Elsevier Interactive Patient Education ©2016 Elsevier    Inc. ° °

## 2015-07-03 NOTE — ED Notes (Signed)
Approximately 40 minutes prior to arrival,  Patient was bitten by the neighbor's plot hound dog. Patient has gaping wound on lateral side of left ankle. Patient was given of Fentanyl IV enroute.. Patient is able to move toes, foot is cool to the touch.

## 2015-07-03 NOTE — ED Provider Notes (Signed)
[facility] Emergency Department Provider Note     Time seen: ----------------------------------------- 2:46 PM on 07/03/2015 -----------------------------------------    I have reviewed the triage vital signs and the nursing notes.   HISTORY  Chief Complaint Animal Bite    HPI Colleen Mccarthy is a 19 y.o. female who presents ER after a dog bite. Patient states proximal and 40 minutes prior to arrival she was bitten by her neighbor's dog. Dog and just recently been let out of quarantine for by another person, she was noted to have a large wound around her left ankle and foot. She was given 50 g fentanyl IV prior to arrival.She is complaining of severe pain over her ankle and foot.   Past Medical History  Diagnosis Date  . Medical history non-contributory     Patient Active Problem List   Diagnosis Date Noted  . Pregnant 02/20/2015  . Term pregnancy delivered 02/20/2015  . Labor and delivery, indication for care 02/11/2015  . Drug abuse 02/11/2015  . Insufficient prenatal care in third trimester 02/11/2015    History reviewed. No pertinent past surgical history.  Allergies Vicodin  Social History Social History  Substance Use Topics  . Smoking status: Current Every Day Smoker    Types: Cigarettes  . Smokeless tobacco: None  . Alcohol Use: Yes     Comment: occasionally    Review of Systems Constitutional: Negative for fever. Genitourinary: Negative for dysuria. Musculoskeletal: Positive for left foot and ankle pain Skin: Positive for large dog bite and laceration Neurological: Negative for headaches, focal weakness or numbness.   ____________________________________________   PHYSICAL EXAM:  VITAL SIGNS: ED Triage Vitals  Enc Vitals Group     BP 07/03/15 1439 129/77 mmHg     Pulse Rate 07/03/15 1439 77     Resp --      Temp 07/03/15 1439 98 F (36.7 C)     Temp Source 07/03/15 1439 Oral     SpO2 07/03/15 1439 100 %      Weight 07/03/15 1439 136 lb (61.689 kg)     Height 07/03/15 1439  (1.626 m)     Head Cir --      Peak Flow --      Pain Score 07/03/15 1440 9     Pain Loc --      Pain Edu? --      Excl. in GC? --     Constitutional: Alert and oriented. Well appearing and in no distress. Musculoskeletal: Left lower extremity is exquisitely tender to touch around the left foot and ankle. There is a large dog bite and lacerations and several locations. Patient appears to be able to range her ankle and tube including dorsi and plantar flexion, inversion and eversion. Neurologic:  Normal speech and language. No gross focal neurologic deficits are appreciated. Speech is normal. No gait instability. Skin: Extensive skin lacerations due to dog bite particularly over the lateral aspect the left lower extremity approach the ankle. There is visual exposed peroneal tendon. There appears to be some tendon injury visibly. Psychiatric: Patient is visibly upset and tearful.  ____________________________________________  ED COURSE:  Pertinent labs & imaging results that were available during my care of the patient were reviewed by me and considered in my medical decision making (see chart for details). We'll obtain x-rays of the left lower extremity, she will need suture repair and likely orthopedics consult due to tendon injury.  IMPRESSION: Extensive soft tissue injury. No acute bony abnormalities.  LACERATION REPAIR Performed by: Emily Filbert Authorized by: Daryel November E Consent: Verbal consent obtained. Risks and benefits: risks, benefits and alternatives were discussed Consent given by: patient Patient identity confirmed: provided demographic data Prepped and Draped in normal sterile fashion Wound explored  Laceration Location: Left ankle 2  Laceration Length: 2, 4 cm  No Foreign Bodies seen or palpated  Anesthesia: local infiltration  Local anesthetic: lidocaine 1 % with  epinephrine  Anesthetic total: 5 ml  Irrigation method: syringe Amount of cleaning: standard  Skin closure: 4-0 Ethilon   Number of sutures: 12   Technique: Running   Patient tolerance: Patient tolerated the procedure well with no immediate complications.  LACERATION REPAIR Performed by: Emily Filbert Authorized by: Daryel November E Consent: Verbal consent obtained. Risks and benefits: risks, benefits and alternatives were discussed Consent given by: patient Patient identity confirmed: provided demographic data Prepped and Draped in normal sterile fashion Wound explored  Laceration Location: Left lower leg laterally  Laceration Length: 20 cm  No Foreign Bodies seen or palpated  Anesthesia: local infiltration  Local anesthetic: lidocaine 1 % with epinephrine  Anesthetic total: 8 ml  Irrigation method: syringe Amount of cleaning: standard  Skin closure: 4-0 Ethilon   Number of sutures: 25   Technique: Running and interrupted. One vertical mattress stitch was applied to the most tense part of the wound   Patient tolerance: Patient tolerated the procedure well with no immediate complications.  ____________________________________________   RADIOLOGY  Left ankle IMPRESSION: Extensive soft tissue injury.  No acute bony abnormalities. ____________________________________________  FINAL ASSESSMENT AND PLAN  Dog bite, complex laceration-total length 26 cm  Plan: Patient with labs and imaging as dictated above. Patient is in no acute distress, there is no identifiable deep or tendinous injury. Patient was placed in a splint, I have discussed with orthopedics given the extent of the wound and possibly for tendon injury. She can follow-up or recheck with orthopedics on Monday or Tuesday for reevaluation. She will be discharged and a box and pain medicine.   Emily Filbert, MD   Emily Filbert, MD 07/03/15 [phone]

## 2015-08-12 ENCOUNTER — Encounter: Payer: Medicaid Other | Attending: Surgery | Admitting: Surgery

## 2015-08-12 ENCOUNTER — Other Ambulatory Visit
Admission: RE | Admit: 2015-08-12 | Discharge: 2015-08-12 | Disposition: A | Discharge status: Home / Self Care | Payer: Medicaid Other | Source: Ambulatory Visit | Attending: Surgery | Admitting: Surgery

## 2015-08-12 DIAGNOSIS — Z029 Encounter for administrative examinations, unspecified: Secondary | ICD-10-CM | POA: Insufficient documentation

## 2015-08-12 DIAGNOSIS — L97822 Non-pressure chronic ulcer of other part of left lower leg with fat layer exposed: Secondary | ICD-10-CM | POA: Insufficient documentation

## 2015-08-12 DIAGNOSIS — S81802A Unspecified open wound, left lower leg, initial encounter: Secondary | ICD-10-CM | POA: Insufficient documentation

## 2015-08-12 DIAGNOSIS — F17210 Nicotine dependence, cigarettes, uncomplicated: Secondary | ICD-10-CM | POA: Diagnosis not present

## 2015-08-12 DIAGNOSIS — R600 Localized edema: Secondary | ICD-10-CM | POA: Diagnosis not present

## 2015-08-12 DIAGNOSIS — S81812A Laceration without foreign body, left lower leg, initial encounter: Secondary | ICD-10-CM | POA: Insufficient documentation

## 2015-08-12 DIAGNOSIS — W540XXA Bitten by dog, initial encounter: Secondary | ICD-10-CM | POA: Insufficient documentation

## 2015-08-13 NOTE — Progress Notes (Signed)
Colleen, BRINES ([id number]) Visit Report for 08/12/2015 Abuse/Suicide Risk Screen Details Patient Name: Colleen Mccarthy, Colleen Mccarthy 08/12/2015 12:45 Date of Service: PM Medical Record [id number] Number: Patient Account Number: [id number] Aug 03, 1996 (19 y.o. Treating RN: Curtis Sites Date of Birth/Sex: Female) Other Clinician: Primary Care Physician: PATIENT, NO Treating BURNS III, WALTER Referring Physician: Karleen Hampshire, NICOLE Physician/Extender: Weeks in Treatment: 0 Abuse/Suicide Risk Screen Items Answer ABUSE/SUICIDE RISK SCREEN: Has anyone close to you tried to hurt or harm you recentlyo No Do you feel uncomfortable with anyone in your familyo No Has anyone forced you do things that you didnot want to doo No Do you have any thoughts of harming yourselfo No Patient displays signs or symptoms of abuse and/or neglect. No Electronic Signature(s) Signed: 08/12/2015 5:05:32 PM By: Curtis Sites Entered By: Curtis Sites on 08/12/2015 12:58:30 Colleen Melia ([id number]) -------------------------------------------------------------------------------- Activities of Daily Living Details Patient Name: [name], Mccarthy 08/12/2015 12:45 Date of Service: PM Medical Record [id number] Number: Patient Account Number: [id number] 1996/07/16 (19 y.o. Treating RN: Curtis Sites Date of Birth/Sex: Female) Other Clinician: Primary Care Physician: PATIENT, NO Treating BURNS III, WALTER Referring Physician: Martie Round Physician/Extender: Weeks in Treatment: 0 Activities of Daily Living Items Answer Activities of Daily Living (Please select one for each item) Drive Automobile Not Able Take Medications Completely Able Use Telephone Completely Able Care for Appearance Completely Able Use Toilet Completely Able Bath / Shower Completely Able Dress Self Completely Able Feed Self Completely Able Walk Completely Able Get In / Out Bed Completely Able Housework Completely Able Prepare Meals Completely  Able Handle Money Completely Able Shop for Self Completely Able Electronic Signature(s) Signed: 08/12/2015 5:05:32 PM By: Curtis Sites Entered By: Curtis Sites on 08/12/2015 12:58:58 Colleen Melia ([id number]) -------------------------------------------------------------------------------- Education Assessment Details Patient Name: Colleen, Mccarthy 08/12/2015 12:45 Date of Service: PM Medical Record [id number] Number: Patient Account Number: [id number] 10-17-1995 (19 y.o. Treating RN: Curtis Sites Date of Birth/Sex: Female) Other Clinician: Primary Care Physician: PATIENT, NO Treating BURNS III, WALTER Referring Physician: Martie Round Physician/Extender: Weeks in Treatment: 0 Primary Learner Assessed: Patient Learning Preferences/Education Level/Primary Language Learning Preference: Explanation, Demonstration Highest Education Level: High School Preferred Language: English Cognitive Barrier Assessment/Beliefs Language Barrier: No Translator Needed: No Memory Deficit: No Emotional Barrier: No Cultural/Religious Beliefs Affecting Medical No Care: Physical Barrier Assessment Impaired Vision: No Impaired Hearing: Yes Hearing Aid Decreased Hand dexterity: No Knowledge/Comprehension Assessment Knowledge Level: Medium Comprehension Level: Medium Ability to understand written Medium instructions: Ability to understand verbal Medium instructions: Motivation Assessment Anxiety Level: Calm Cooperation: Cooperative Education Importance: Acknowledges Need Interest in Health Problems: Asks Questions Perception: Coherent Willingness to Engage in Self- Medium Management Activities: Readiness to Engage in Self- Medium Management Activities: OTHELLA, SLAPPEY ([id number]) Electronic Signature(s) Signed: 08/12/2015 5:05:32 PM By: Curtis Sites Entered By: Curtis Sites on 08/12/2015 12:59:30 Colleen Melia  ([id number]) -------------------------------------------------------------------------------- Fall Risk Assessment Details Patient Name: Colleen, Mccarthy 08/12/2015 12:45 Date of Service: PM Medical Record [id number] Number: Patient Account Number: [id number] [birth year] (19 y.o. Treating RN: Curtis Sites Date of Birth/Sex: Female) Other Clinician: Primary Care Physician: PATIENT, NO Treating BURNS III, WALTER Referring Physician: Martie Round Physician/Extender: Weeks in Treatment: 0 Fall Risk Assessment Items FALL RISK ASSESSMENT: History of falling - immediate or within 3 months 0 No Secondary diagnosis 0 No Ambulatory aid None/bed rest/wheelchair/nurse 0 No Crutches/cane/walker 15 Yes Furniture 0 No IV Access/Saline Lock 0 No Gait/Training Normal/bed rest/immobile 0 Yes Weak 0 No Impaired 0 No Mental Status Oriented to own ability 0 Yes Electronic Signature(s) Signed: 08/12/2015 5:05:32 PM By: Francesco Sor,  Mardene CelesteJoanna Entered By: Curtis Sitesorthy, Joanna on 08/12/2015 12:59:43 Colleen MeliaBAILEY, Janyiah ([id number]) -------------------------------------------------------------------------------- Foot Assessment Details Patient Name: Colleen MeliaBAILEY, Tephanie 08/12/2015 12:45 Date of Service: PM Medical Record [id number] Number: Patient Account Number: [id number] 09/08/1996 (19 y.o. Treating RN: Curtis Sitesorthy, Joanna Date of Birth/Sex: Female) Other Clinician: Primary Care Physician: PATIENT, NO Treating BURNS III, WALTER Referring Physician: Martie RoundSPENCER, NICOLE Physician/Extender: Weeks in Treatment: 0 Foot Assessment Items Site Locations + = Sensation present, - = Sensation absent, C = Callus, U = Ulcer R = Redness, W = Warmth, M = Maceration, PU = Pre-ulcerative lesion F = Fissure, S = Swelling, D = Dryness Assessment Right: Left: Other Deformity: No No Prior Foot Ulcer: No No Prior Amputation: No No Charcot Joint: No No Ambulatory Status: Ambulatory With Help Assistance Device: Crutches Gait:  Steady Electronic Signature(s) Signed: 08/12/2015 5:05:32 PM By: Curtis Sitesorthy, Joanna Entered By: Curtis Sitesorthy, Joanna on 08/12/2015 13:00:12 Colleen MeliaBAILEY, Verne ([name]) Ensminger, Geet ([id number]) -------------------------------------------------------------------------------- Nutrition Risk Assessment Details Patient Name: Colleen MeliaBAILEY, Taneeka 08/12/2015 12:45 Date of Service: PM Medical Record [id number] Number: Patient Account Number: [id number] 03/19/1996 (19 y.o. Treating RN: Curtis Sitesorthy, Joanna Date of Birth/Sex: Female) Other Clinician: Primary Care Physician: PATIENT, NO Treating BURNS III, WALTER Referring Physician: Karleen HampshireSPENCER, NICOLE Physician/Extender: Weeks in Treatment: 0 Height (in): Weight (lbs): Body Mass Index (BMI): Nutrition Risk Assessment Items NUTRITION RISK SCREEN: I have an illness or condition that made me change the kind and/or 0 No amount of food I eat I eat fewer than two meals per day 0 No I eat few fruits and vegetables, or milk products 0 No I have three or more drinks of beer, liquor or wine almost every day 0 No I have tooth or mouth problems that make it hard for me to eat 0 No I don't always have enough money to buy the food I need 0 No I eat alone most of the time 0 No I take three or more different prescribed or over-the-counter drugs a 1 Yes day Without wanting to, I have lost or gained 10 pounds in the last six 0 No months I am not always physically able to shop, cook and/or feed myself 0 No Nutrition Protocols Good Risk Protocol 0 No interventions needed Moderate Risk Protocol Electronic Signature(s) Signed: 08/12/2015 5:05:32 PM By: Curtis Sitesorthy, Joanna Entered By: Curtis Sitesorthy, Joanna on 08/12/2015 12:59:49

## 2015-08-13 NOTE — Progress Notes (Signed)
TANI, VIRGO ([id number]) Visit Report for 08/12/2015 Chief Complaint Document Details Patient Name: Colleen Mccarthy, Colleen Mccarthy 08/12/2015 12:45 Date of Service: PM Medical Record [id number] Number: Patient Account Number: [id number] October 15, 1995 (19 y.o. Treating RN: Curtis Sites Date of Birth/Sex: Female) Other Clinician: Primary Care Physician: PATIENT, NO Treating BURNS III, Kharlie Bring Referring Physician: Martie Round Physician/Extender: Weeks in Treatment: 0 Information Obtained from: Patient Chief Complaint Left leg ulceration status post dog bite Electronic Signature(s) Signed: 08/12/2015 4:30:30 PM By: Madelaine Bhat MD Entered By: Madelaine Bhat on 08/12/2015 14:02:02 Colleen Melia ([id number]) -------------------------------------------------------------------------------- Debridement Details Patient Name: Colleen Mccarthy 08/12/2015 12:45 Date of Service: PM Medical Record [id number] Number: Patient Account Number: [id number] [birth year] (19 y.o. Treating RN: Curtis Sites Date of Birth/Sex: Female) Other Clinician: Primary Care Physician: PATIENT, NO Treating BURNS III, Bernie Fobes Referring Physician: Martie Round Physician/Extender: Weeks in Treatment: 0 Debridement Performed for Wound #1 Left,Lateral Lower Leg Assessment: Performed By: Physician BURNS III, Melanie Crazier., MD Debridement: Debridement Pre-procedure Yes Verification/Time Out Taken: Start Time: 13:32 Pain Control: Lidocaine 4% Topical Solution Level: Skin/Subcutaneous Tissue Total Area Debrided (L x 3.2 (cm) x 6.1 (cm) = 19.52 (cm) W): Tissue and other Viable, Non-Viable, Fat, Fibrin/Slough, Skin, Subcutaneous material debrided: Instrument: Forceps, Scissors Bleeding: Minimum Hemostasis Achieved: Pressure End Time: 13:34 Procedural Pain: 0 Post Procedural Pain: 0 Response to Treatment: Procedure was tolerated well Post Debridement Measurements of Total Wound Length: (cm) 3.2 Width: (cm)  6.1 Depth: (cm) 0.5 Volume: (cm) 7.665 Post Procedure Diagnosis Same as Pre-procedure Electronic Signature(s) Signed: 08/12/2015 4:30:30 PM By: Madelaine Bhat MD Signed: 08/12/2015 5:05:32 PM By: Curtis Sites Entered By: Madelaine Bhat on 08/12/2015 14:01:24 Colleen Melia ([id number]) -------------------------------------------------------------------------------- HPI Details Patient Name: Colleen Mccarthy 08/12/2015 12:45 Date of Service: PM Medical Record [id number] Number: Patient Account Number: [id number] 10-01-1995 (19 y.o. Treating RN: Curtis Sites Date of Birth/Sex: Female) Other Clinician: Primary Care Physician: PATIENT, NO Treating BURNS III, Eda Magnussen Referring Physician: Martie Round Physician/Extender: Weeks in Treatment: 0 History of Present Illness HPI Description: Pleasant 19 year old with no significant past medical history except for smoking. She was bitten by a dog on 07/03/2015. She suffered puncture wounds and a laceration to her left lateral calf. The laceration was sutured. The patient said that when the sutures were removed 2 weeks later the wound dehisced. She is taking Augmentin. Not performing any dressing changes. She is able to bear weight but has been using crutches for ambulation. She reports slow improvement. Moderate pain with pressure. No ischemic rest pain or claudication. No fever or chills. Moderate serosanguineous drainage. Electronic Signature(s) Signed: 08/12/2015 4:30:30 PM By: Madelaine Bhat MD Entered By: Madelaine Bhat on 08/12/2015 14:04:05 Colleen Melia ([id number]) -------------------------------------------------------------------------------- Physical Exam Details Patient Name: Colleen Mccarthy 08/12/2015 12:45 Date of Service: PM Medical Record [id number] Number: Patient Account Number: [id number] 1996/03/30 (19 y.o. Treating RN: Curtis Sites Date of Birth/Sex: Female) Other Clinician: Primary Care Physician:  PATIENT, NO Treating BURNS III, Bedie Dominey Referring Physician: Karleen Hampshire, NICOLE Physician/Extender: Weeks in Treatment: 0 Constitutional . Pulse regular. Respirations normal and unlabored. Afebrile. Marland Kitchen Respiratory WNL. No retractions.. Cardiovascular Pedal Pulses WNL. Integumentary (Hair, Skin) .Marland Kitchen Neurological Sensation normal to touch, pin,and vibration. Psychiatric Judgement and insight Intact.. Oriented times 3.. No evidence of depression, anxiety, or agitation.. Notes Left lateral calf ulceration. Full-thickness. Healthy granulating base. Biofilm debrided. Central necrotic fat sharply debrided. Biopsy culture obtained. No exposed deep structures. Localized edema. No significant cellulitis. Moderately tender. Palpable DP. No ABIs obtained. Electronic Signature(s) Signed: 08/12/2015 4:30:30 PM By:  Madelaine BhatBurns, III, Lucee Brissett MD Entered By: Madelaine BhatBurns, III, Osias Resnick on 08/12/2015 14:05:11 Colleen MeliaBAILEY, Shanetra ([id number]) -------------------------------------------------------------------------------- Physician Orders Details Patient Name: Colleen Mccarthy 08/12/2015 12:45 Date of Service: PM Medical Record [id number] Number: Patient Account Number: [id number] 06/21/1996 (19 y.o. Treating RN: Curtis Sitesorthy, Joanna Date of Birth/Sex: Female) Other Clinician: Primary Care Physician: PATIENT, NO Treating BURNS III, Shrihan Putt Referring Physician: Martie RoundSPENCER, NICOLE Physician/Extender: Weeks in Treatment: 0 Verbal / Phone Orders: Yes Clinician: Dorthy, Joanna Read Back and Verified: Yes Diagnosis Coding Wound Cleansing Wound #1 Left,Lateral Lower Leg o Clean wound with Normal Saline. Anesthetic Wound #1 Left,Lateral Lower Leg o Topical Lidocaine 4% cream applied to wound bed prior to debridement Primary Wound Dressing Wound #1 Left,Lateral Lower Leg o Aquacel Ag Secondary Dressing Wound #1 Left,Lateral Lower Leg o ABD and Kerlix/Conform Dressing Change Frequency Wound #1 Left,Lateral Lower Leg o  Change dressing every other day. Follow-up Appointments Wound #1 Left,Lateral Lower Leg o Return Appointment in 1 week. Edema Control Wound #1 Left,Lateral Lower Leg o Elevate legs to the level of the heart and pump ankles as often as possible o Tubigrip Additional Orders / Instructions Wound #1 Left,Lateral Lower Leg o Stop Smoking Colleen MeliaBAILEY, Ara ([id number]) Laboratory o Culture and Sensitivity - left lower leg oooo Electronic Signature(s) Signed: 08/12/2015 4:30:30 PM By: Madelaine BhatBurns, III, Labrenda Lasky MD Signed: 08/12/2015 5:05:32 PM By: Curtis Sitesorthy, Joanna Entered By: Curtis Sitesorthy, Joanna on 08/12/2015 13:34:44 Colleen MeliaBAILEY, Sarayah ([id number]) -------------------------------------------------------------------------------- Problem List Details Patient Name: Colleen MeliaBAILEY, Angeliah 08/12/2015 12:45 Date of Service: PM Medical Record [id number] Number: Patient Account Number: [id number] 11/04/1995 (19 y.o. 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 F17.210 Nicotine dependence, cigarettes, uncomplicated 08/12/2015 Yes R60.0 Localized edema 08/12/2015 Yes Inactive Problems Resolved Problems Electronic Signature(s) Signed: 08/12/2015 4:30:30 PM By: Madelaine BhatBurns, III, Whitlee Sluder MD Entered By: Madelaine BhatBurns, III, Debbie Yearick on 08/12/2015 14:01:43 Colleen MeliaBAILEY, Vicky ([id number]) -------------------------------------------------------------------------------- Progress Note/History and Physical Details Patient Name:  Colleen MeliaBAILEY, Tekelia 08/12/2015 12:45 Date of Service: PM Medical Record [id number] Number: Patient Account Number: [id number] 05/28/1996 (19 y.o. Treating RN: Curtis Sitesorthy, Joanna Date of Birth/Sex: Female) Other Clinician: Primary Care Physician: PATIENT, NO Treating BURNS III, Chasitty Hehl Referring Physician: Martie RoundSPENCER, NICOLE Physician/Extender: Weeks in Treatment: 0 Subjective Chief Complaint Information obtained from Patient Left leg ulceration status post dog bite History of Present Illness (HPI) Pleasant 19 year old with no significant past medical history except for smoking. She was bitten by a dog on 07/03/2015. She suffered puncture wounds and a laceration to her left lateral calf. The laceration was sutured. The patient said that when the sutures were removed 2 weeks later the wound dehisced. She is taking Augmentin. Not performing any dressing changes. She is able to bear weight but has been using crutches for ambulation. She reports slow improvement. Moderate pain with pressure. No ischemic rest pain or claudication. No fever or chills. Moderate serosanguineous drainage. Wound History Patient presents with 1 open wound that has been present for approximately 6 weeks. Patient has been treating wound in the following manner: no. Laboratory tests have been performed in the last month. Patient reportedly has not tested positive for an antibiotic resistant organism. Patient reportedly has not  tested positive for osteomyelitis. Patient reportedly has not had testing performed to evaluate circulation in the legs. Patient experiences the following problems associated with their wounds: infection. Patient History Information obtained from Patient. Allergies Vicodin (Severity: Severe) Social History Current every day smoker, Marital Status - Single, Alcohol Use - Rarely, Drug Use - No History, Caffeine Use - Moderate. Medical History Oncologic Denies history of Received Chemotherapy, Received  Radiation Psychiatric Denies history of Baruch Gouty, Confinement Anxiety DANYELA, POSAS ([id number]) Medical And Surgical History Notes Ear/Nose/Mouth/Throat hearing aids Review of Systems (ROS) Constitutional Symptoms (General Health) The patient has no complaints or symptoms. Eyes Complains or has symptoms of Glasses / Contacts - glasses. Ear/Nose/Mouth/Throat The patient has no complaints or symptoms. Hematologic/Lymphatic The patient has no complaints or symptoms. Respiratory The patient has no complaints or symptoms. Cardiovascular The patient has no complaints or symptoms. Gastrointestinal The patient has no complaints or symptoms. Endocrine The patient has no complaints or symptoms. Genitourinary The patient has no complaints or symptoms. Immunological The patient has no complaints or symptoms. Integumentary (Skin) The patient has no complaints or symptoms. Musculoskeletal The patient has no complaints or symptoms. Neurologic The patient has no complaints or symptoms. Oncologic The patient has no complaints or symptoms. Psychiatric The patient has no complaints or symptoms. Objective Constitutional Pulse regular. Respirations normal and unlabored. Afebrile. Vitals Time Taken: 1:03 PM, Height: 66 in, Source: Stated, Weight: 135 lbs, Source: Stated, BMI: 21.8, Temperature: 98.2 F, Pulse: 76 bpm, Respiratory Rate: 18 breaths/min, Blood Pressure: 118/83 mmHg. JOHANNAH, ROZAS ([id number]) Respiratory WNL. No retractions.. Cardiovascular Pedal Pulses WNL. Neurological Sensation normal to touch, pin,and vibration. Psychiatric Judgement and insight Intact.. Oriented times 3.. No evidence of depression, anxiety, or agitation.. General Notes: Left lateral calf ulceration. Full-thickness. Healthy granulating base. Biofilm debrided. Central necrotic fat sharply debrided. Biopsy culture obtained. No exposed deep structures. Localized edema. No significant cellulitis.  Moderately tender. Palpable DP. No ABIs obtained. Integumentary (Hair, Skin) Wound #1 status is Open. Original cause of wound was Bite. The wound is located on the Left,Lateral Lower Leg. The wound measures 3.2cm length x 6.1cm width x 0.4cm depth; 15.331cm^2 area and 6.132cm^3 volume. The wound is limited to skin breakdown. There is no tunneling or undermining noted. There is a large amount of serosanguineous drainage noted. The wound margin is flat and intact. There is large (67-100%) red granulation within the wound bed. There is a small (1-33%) amount of necrotic tissue within the wound bed including Adherent Slough. The periwound skin appearance exhibited: Moist. The periwound skin appearance did not exhibit: Callus, Crepitus, Excoriation, Fluctuance, Friable, Induration, Localized Edema, Rash, Scarring, Dry/Scaly, Maceration, Atrophie Blanche, Cyanosis, Ecchymosis, Hemosiderin Staining, Mottled, Pallor, Rubor, Erythema. The periwound has tenderness on palpation. 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 Procedures Wound #1 Wound #1 is a Trauma, Other located on the Left,Lateral Lower Leg . There was a Skin/Subcutaneous Tissue Debridement ([id number]) debridement with total area of 19.52 sq cm performed by BURNS III, Melanie Crazier., MD. with the following instrument(s): Forceps and Scissors to remove Viable and Non-Viable tissue/material including Fat, Fibrin/Slough, Skin, and Subcutaneous after achieving pain control using Lidocaine 4% Topical Solution. A time out was conducted prior  to the start of the  procedure. A Minimum amount of bleeding was controlled with Pressure. The procedure was tolerated well with a pain level of 0 throughout and a pain level of 0 following the procedure. Post Debridement Measurements: 3.2cm length x 6.1cm width x 0.5cm depth; 7.665cm^3 volume. Post procedure Diagnosis Wound #1: Same as Pre-Procedure Plan Wound Cleansing: Wound #1 Left,Lateral Lower Leg: Clean wound with Normal Saline. Anesthetic: Wound #1 Left,Lateral Lower Leg: Topical Lidocaine 4% cream applied to wound bed prior to debridement Primary Wound Dressing: Wound #1 Left,Lateral Lower Leg: Aquacel Ag Secondary Dressing: Wound #1 Left,Lateral Lower Leg: ABD and Kerlix/Conform Dressing Change Frequency: Wound #1 Left,Lateral Lower Leg: Change dressing every other day. Follow-up Appointments: Wound #1 Left,Lateral Lower Leg: Return Appointment in 1 week. Edema Control: Wound #1 Left,Lateral Lower Leg: Elevate legs to the level of the heart and pump ankles as often as possible Tubigrip Additional Orders / Instructions: Wound #1 Left,Lateral Lower Leg: Stop Smoking Laboratory ordered were: Culture and Sensitivity - left lower leg RABECCA, BIRGE ([id number]) Complete Augmentin. Follow-up on wound cultures obtained today. Silver alginate dressing changes. Edema control with Tubigrip. Frequent leg elevation. Lidocaine topical when necessary. Smoking cessation recommendations given. Electronic Signature(s) Signed: 08/12/2015 4:30:30 PM By: Madelaine Bhat MD Entered By: Madelaine Bhat on 08/12/2015 14:06:24 Colleen Melia ([id number]) -------------------------------------------------------------------------------- ROS/PFSH Details Patient Name: DARIUS, FILLINGIM 08/12/2015 12:45 Date of Service: PM Medical Record [id number] Number: Patient Account Number: [id number] 1996-07-19 (19 y.o. Treating RN: Curtis Sites Date of Birth/Sex: Female) Other Clinician: Primary Care Physician:  PATIENT, NO Treating BURNS III, Alejandra Hunt Referring Physician: Karleen Hampshire, NICOLE Physician/Extender: Weeks in Treatment: 0 Label Progress Note Print Version as History and Physical for this encounter Information Obtained From Patient Wound History Do you currently have one or more open woundso Yes How many open wounds do you currently haveo 1 Approximately how long have you had your woundso 6 weeks How have you been treating your wound(s) until nowo no Has your wound(s) ever healed and then re-openedo No Have you had any lab work done in the past montho Yes Who ordered the lab work doneo [facility] Have you tested positive for an antibiotic resistant organism (MRSA, VRE)o No Have you tested positive for osteomyelitis (bone infection)o No Have you had any tests for circulation on your legso No Have you had other problems associated with your woundso Infection Eyes Complaints and Symptoms: Positive for: Glasses / Contacts - glasses Psychiatric Complaints and Symptoms: No Complaints or Symptoms Complaints and Symptoms: Negative for: Anxiety; Claustrophobia Medical History: Negative for: Anorexia/bulimia; Confinement Anxiety Constitutional Symptoms (General Health) Complaints and Symptoms: No Complaints or Symptoms Ear/Nose/Mouth/Throat TAYLLOR, BREITENSTEIN ([id number]) Complaints and Symptoms: No Complaints or Symptoms Medical History: Past Medical History Notes: hearing aids Hematologic/Lymphatic Complaints and Symptoms: No Complaints or Symptoms Respiratory Complaints and Symptoms: No Complaints or Symptoms Cardiovascular Complaints and Symptoms: No Complaints or Symptoms Gastrointestinal Complaints and Symptoms: No Complaints or Symptoms Endocrine Complaints and Symptoms: No Complaints or Symptoms Genitourinary Complaints and Symptoms: No Complaints or Symptoms Immunological Complaints and Symptoms: No Complaints or Symptoms Integumentary (Skin) Complaints and  Symptoms: No Complaints or Symptoms Musculoskeletal Complaints and Symptoms: No Complaints or Symptoms JAILEEN, JANELLE ([id number]) Neurologic Complaints and Symptoms: No Complaints or Symptoms Oncologic Complaints and Symptoms: No Complaints or Symptoms Medical History: Negative for: Received Chemotherapy; Received Radiation Family and Social History Current every day smoker; Marital Status - Single; Alcohol Use: Rarely; Drug Use: No History; Caffeine Use: Moderate; Financial Concerns: No; Food,  Clothing or Shelter Needs: No; Support System Lacking: No; Transportation Concerns: No; Advanced Directives: No; Patient does not want information on Advanced Directives Physician Affirmation I have reviewed and agree with the above information. Electronic Signature(s) Signed: 08/12/2015 4:30:30 PM By: Madelaine Bhat MD Signed: 08/12/2015 5:05:32 PM By: Curtis Sites Entered By: Madelaine Bhat on 08/12/2015 14:00:29 Colleen Melia ([id number]) -------------------------------------------------------------------------------- SuperBill Details Patient Name: Colleen Melia Date of Service: 08/12/2015 Medical Record Number: [id number] Patient Account Number: [id number] Date of Birth/Sex: 1995-10-16 (19 y.o. Female) Treating RN: Curtis Sites Primary Care Physician: PATIENT, NO Other Clinician: Referring Physician: Martie Round Treating Physician/Extender: BURNS III, Shenelle Klas Weeks in Treatment: 0 Diagnosis Coding ICD-10 Codes Code Description S81.802A Unspecified open wound, left lower leg, initial encounter S81.812A Laceration without foreign body, left lower leg, initial encounter L97.822 Non-pressure chronic ulcer of other part of left lower leg with fat layer exposed F17.210 Nicotine dependence, cigarettes, uncomplicated R60.0 Localized edema Facility Procedures CPT4: Description Modifier Quantity Code [id number] 11042 - DEB SUBQ TISSUE 20 SQ CM/< 1 ICD-10 Description Diagnosis  S81.802A Unspecified open wound, left lower leg, initial encounter H08.657Q Laceration without foreign body, left lower leg, initial  encounter L97.822 Non-pressure chronic ulcer of other part of left lower leg with fat layer exposed Physician Procedures CPT4: Description Modifier Quantity Code 4696295 99204 - WC PHYS LEVEL 4 - NEW PT 1 ICD-10 Description Diagnosis S81.802A Unspecified open wound, left lower leg, initial encounter M84.132G Laceration without foreign body, left lower leg, initial  encounter L97.822 Non-pressure chronic ulcer of other part of left lower leg with fat layer exposed R60.0 Localized edema CPT4: 4010272 11042 - WC PHYS SUBQ TISS 20 SQ CM 1 ICD-10 Description Diagnosis S81.802A Unspecified open wound, left lower leg, initial encounter TECORA, EUSTACHE ([id number]) Electronic Signature(s) Signed: 08/12/2015 4:30:30 PM By: Madelaine Bhat MD Entered By: Madelaine Bhat on 08/12/2015 14:06:51

## 2015-08-13 NOTE — Progress Notes (Signed)
Colleen Mccarthy ([id number]) Visit Report for 08/12/2015 Allergy List Details Patient Name: Colleen Mccarthy, Colleen Mccarthy Date of Service: 08/12/2015 12:45 PM Medical Record Number: [id number] Patient Account Number: [id number] Date of Birth/Sex: May 14, 1996 (19 y.o. Female) Treating RN: Curtis Sites Primary Care Physician: PATIENT, NO Other Clinician: Referring Physician: Martie Round Treating Physician/Extender: BURNS III, WALTER Weeks in Treatment: 0 Allergies Active Allergies Vicodin Severity: Severe Allergy Notes Electronic Signature(s) Signed: 08/12/2015 5:05:32 PM By: Curtis Sites Entered By: Curtis Sites on 08/12/2015 13:02:54 Colleen Mccarthy ([id number]) -------------------------------------------------------------------------------- Arrival Information Details Patient Name: Colleen Mccarthy Date of Service: 08/12/2015 12:45 PM Medical Record Number: [id number] Patient Account Number: [id number] Date of Birth/Sex: 11-15-1995 (19 y.o. Female) Treating RN: Curtis Sites Primary Care Physician: PATIENT, NO Other Clinician: Referring Physician: Martie Round Treating Physician/Extender: BURNS III, WALTER Weeks in Treatment: 0 Visit Information Patient Arrived: Crutches Arrival Time: 12:54 Accompanied By: self Transfer Assistance: None Patient Identification Verified: Yes Secondary Verification Process Yes Completed: Electronic Signature(s) Signed: 08/12/2015 5:05:32 PM By: Curtis Sites Entered By: Curtis Sites on 08/12/2015 12:54:57 Colleen Mccarthy ([id number]) -------------------------------------------------------------------------------- Clinic Level of Care Assessment Details Patient Name: Colleen Mccarthy Date of Service: 08/12/2015 12:45 PM Medical Record Number: [id number] Patient Account Number: [id number] Date of Birth/Sex: 1996-03-15 (19 y.o. Female) Treating RN: Curtis Sites Primary Care Physician: PATIENT, NO Other Clinician: Referring Physician: Martie Round Treating Physician/Extender: BURNS III, WALTER Weeks in Treatment: 0 Clinic Level of Care Assessment Items TOOL 1 Quantity Score  - Use when EandM and Procedure is performed on INITIAL visit 0 ASSESSMENTS - Nursing Assessment / Reassessment X - General Physical Exam (combine w/ comprehensive assessment (listed just 1 20 below) when performed on new pt. evals) X - Comprehensive Assessment (HX, ROS, Risk Assessments, Wounds Hx, etc.) 1 25 ASSESSMENTS - Wound and Skin Assessment / Reassessment  - Dermatologic / Skin Assessment (not related to wound area) 0 ASSESSMENTS - Ostomy and/or Continence Assessment and Care  - Incontinence Assessment and Management 0  - Ostomy Care Assessment and Management (repouching, etc.) 0 PROCESS - Coordination of Care X - Simple Patient / Family Education for ongoing care 1 15  - Complex (extensive) Patient / Family Education for ongoing care 0 X - Staff obtains Consents, Records, Test Results / Process Orders 1 10  - Staff telephones HHA, Nursing Homes / Clarify orders / etc 0  - Routine Transfer to another Facility (non-emergent condition) 0  - Routine Hospital Admission (non-emergent condition) 0 X - New Admissions / Manufacturing engineer / Ordering NPWT, Apligraf, etc. 1 15  - Emergency Hospital Admission (emergent condition) 0 PROCESS - Special Needs  - Pediatric / Minor Patient Management 0  - Isolation Patient Management 0 NIRVANA, BLANCHETT ([id number])  - Hearing / Language / Visual special needs 0  - Assessment of Community assistance (transportation, D/C planning, etc.) 0  - Additional assistance / Altered mentation 0  - Support Surface(s) Assessment (bed, cushion, seat, etc.) 0 INTERVENTIONS - Miscellaneous  - External ear exam 0  - Patient Transfer (multiple staff / Nurse, adult / Similar devices) 0  - Simple Staple / Suture removal (25 or less) 0  - Complex Staple / Suture removal (26 or more) 0   - Hypo/Hyperglycemic Management (do not check if billed separately) 0  - Ankle / Brachial Index (ABI) - do not check if billed separately 0 Has the patient been seen at the hospital within the last three years: Yes Total Score: 85 Level Of Care: New/Established - Level 3 Electronic Signature(s) Signed: 08/12/2015 2:06:31  PM By: Curtis Sites Entered By: Curtis Sites on 08/12/2015 14:06:30 Colleen Mccarthy ([id number]) -------------------------------------------------------------------------------- Encounter Discharge Information Details Patient Name: Colleen Mccarthy Date of Service: 08/12/2015 12:45 PM Medical Record Number: [id number] Patient Account Number: [id number] Date of Birth/Sex: 10-Aug-1996 (19 y.o. Female) Treating RN: Curtis Sites Primary Care Physician: PATIENT, NO Other Clinician: Referring Physician: Martie Round Treating Physician/Extender: BURNS III, WALTER Weeks in Treatment: 0 Encounter Discharge Information Items Discharge Pain Level: 0 Discharge Condition: Stable Ambulatory Status: Crutches Discharge Destination: Home Transportation: Private Auto Accompanied By: self Schedule Follow-up Appointment: Yes Medication Reconciliation completed and provided to Patient/Care No Aevah Stansbery: Provided on Clinical Summary of Care: 08/12/2015 Form Type Recipient Paper Patient JB Electronic Signature(s) Signed: 08/12/2015 2:07:40 PM By: Curtis Sites Previous Signature: 08/12/2015 1:49:16 PM Version By: Gwenlyn Perking Entered By: Curtis Sites on 08/12/2015 14:07:40 Colleen Mccarthy ([id number]) -------------------------------------------------------------------------------- Lower Extremity Assessment Details Patient Name: Colleen Mccarthy Date of Service: 08/12/2015 12:45 PM Medical Record Number: [id number] Patient Account Number: [id number] Date of Birth/Sex: [birth year] (19 y.o. Female) Treating RN: Curtis Sites Primary Care Physician: PATIENT, NO Other  Clinician: Referring Physician: Martie Round Treating Physician/Extender: BURNS III, WALTER Weeks in Treatment: 0 Edema Assessment Assessed: [Left: No] [Right: No] Edema: [Left: Ye] [Right: s] Calf Left: Right: Point of Measurement: 32 cm From Medial Instep 33 cm cm Ankle Left: Right: Point of Measurement: 10 cm From Medial Instep 23.2 cm cm Vascular Assessment Pulses: Posterior Tibial Palpable: [Left:Yes] Doppler: [Left:Multiphasic] Dorsalis Pedis Palpable: [Left:Yes] Doppler: [Left:Multiphasic] Extremity colors, hair growth, and conditions: Extremity Color: [Left:Normal] Hair Growth on Extremity: [Left:Yes] Temperature of Extremity: [Left:Warm] Capillary Refill: [Left:< 3 seconds] Toe Nail Assessment Left: Right: Thick: No Discolored: No Deformed: No Improper Length and Hygiene: No Electronic Signature(s) Signed: 08/12/2015 5:05:32 PM By: Allen Kell, Rossie Muskrat ([id number]) Entered By: Curtis Sites on 08/12/2015 13:19:16 Colleen Mccarthy ([id number]) -------------------------------------------------------------------------------- Multi Wound Chart Details Patient Name: Colleen Mccarthy Date of Service: 08/12/2015 12:45 PM Medical Record Number: [id number] Patient Account Number: [id number] Date of Birth/Sex: Jan 27, 1996 (19 y.o. Female) Treating RN: Curtis Sites Primary Care Physician: PATIENT, NO Other Clinician: Referring Physician: Martie Round Treating Physician/Extender: BURNS III, WALTER Weeks in Treatment: 0 Vital Signs Height(in): 66 Pulse(bpm): 76 Weight(lbs): 135 Blood Pressure 118/83 (mmHg): Body Mass Index(BMI): 22 Temperature(F): 98.2 Respiratory Rate 18 (breaths/min): Photos: [1:No Photos] [N/A:N/A] Wound Location: [1:Left Lower Leg - Lateral] [N/A:N/A] Wounding Event: [1:Bite] [N/A:N/A] Primary Etiology: [1:Trauma, Other] [N/A:N/A] Date Acquired: [1:07/03/2015] [N/A:N/A] Weeks of Treatment: [1:0] [N/A:N/A] Wound Status: [1:Open]  [N/A:N/A] Measurements L x W x D 3.2x6.1x0.4 [N/A:N/A] (cm) Area (cm) : [1:15.331] [N/A:N/A] Volume (cm) : [1:6.132] [N/A:N/A] Classification: [1:Full Thickness Without Exposed Support Structures] [N/A:N/A] Exudate Amount: [1:Large] [N/A:N/A] Exudate Type: [1:Serosanguineous] [N/A:N/A] Exudate Color: [1:red, brown] [N/A:N/A] Wound Margin: [1:Flat and Intact] [N/A:N/A] Granulation Amount: [1:Large (67-100%)] [N/A:N/A] Granulation Quality: [1:Red] [N/A:N/A] Necrotic Amount: [1:Small (1-33%)] [N/A:N/A] Exposed Structures: [1:Fascia: No Fat: No Tendon: No Muscle: No Joint: No Bone: No Limited to Skin Breakdown] [N/A:N/A] Epithelialization: [1:None] [N/A:N/A] Periwound Skin Texture: Edema: No N/A N/A Excoriation: No Induration: No Callus: No Crepitus: No Fluctuance: No Friable: No Rash: No Scarring: No Periwound Skin Moist: Yes N/A N/A Moisture: Maceration: No Dry/Scaly: No Periwound Skin Color: Atrophie Blanche: No N/A N/A Cyanosis: No Ecchymosis: No Erythema: No Hemosiderin Staining: No Mottled: No Pallor: No Rubor: No Tenderness on Yes N/A N/A Palpation: Wound Preparation: Ulcer Cleansing: N/A N/A Rinsed/Irrigated with Saline Topical Anesthetic Applied: Other: lidocaine 4% Treatment Notes Electronic Signature(s) Signed: 08/12/2015 5:05:32 PM By: Curtis Sites Entered By:  Curtis Sites on 08/12/2015 13:32:36 ELLIETTE, SEABOLT ([id number]) -------------------------------------------------------------------------------- Multi-Disciplinary Care Plan Details Patient Name: KEYAIRA, CLAPHAM Date of Service: 08/12/2015 12:45 PM Medical Record Number: [id number] Patient Account Number: [id number] Date of Birth/Sex: 1996/06/22 (19 y.o. Female) Treating RN: Curtis Sites Primary Care Physician: PATIENT, NO Other Clinician: Referring Physician: Martie Round Treating Physician/Extender: BURNS III, WALTER Weeks in Treatment: 0 Active Inactive Abuse / Safety / Falls /  Self Care Management Nursing Diagnoses: Impaired physical mobility Goals: Patient will remain injury free Date Initiated: 08/12/2015 Goal Status: Active Interventions: Assess fall risk on admission and as needed Notes: Orientation to the Wound Care Program Nursing Diagnoses: Knowledge deficit related to the wound healing center program Goals: Patient/caregiver will verbalize understanding of the Wound Healing Center Program Date Initiated: 08/12/2015 Goal Status: Active Interventions: Provide education on orientation to the wound center Notes: Wound/Skin Impairment Nursing Diagnoses: Impaired tissue integrity Goals: Patient/caregiver will verbalize understanding of skin care regimen Date Initiated: 08/12/2015 MARLENY, FALLER ([id number]) Goal Status: Active Ulcer/skin breakdown will have a volume reduction of 30% by week 4 Date Initiated: 08/12/2015 Goal Status: Active Ulcer/skin breakdown will have a volume reduction of 50% by week 8 Date Initiated: 08/12/2015 Goal Status: Active Ulcer/skin breakdown will have a volume reduction of 80% by week 12 Date Initiated: 08/12/2015 Goal Status: Active Ulcer/skin breakdown will heal within 14 weeks Date Initiated: 08/12/2015 Goal Status: Active Interventions: Assess patient/caregiver ability to perform ulcer/skin care regimen upon admission and as needed Assess ulceration(s) every visit Notes: Electronic Signature(s) Signed: 08/12/2015 2:05:57 PM By: Curtis Sites Entered By: Curtis Sites on 08/12/2015 14:05:56 Colleen Mccarthy ([id number]) -------------------------------------------------------------------------------- Patient/Caregiver Education Details Patient Name: Colleen Mccarthy Date of Service: 08/12/2015 12:45 PM Medical Record Number: [id number] Patient Account Number: [id number] Date of Birth/Gender: 12/30/1995 (19 y.o. Female) Treating RN: Curtis Sites Primary Care Physician: PATIENT, NO Other Clinician: Referring  Physician: Martie Round Treating Physician/Extender: BURNS III, WALTER Weeks in Treatment: 0 Education Assessment Education Provided To: Patient Education Topics Provided Wound/Skin Impairment: Handouts: Other: wound care as ordered and reportable s/s Methods: Demonstration, Explain/Verbal Responses: State content correctly Electronic Signature(s) Signed: 08/12/2015 2:08:06 PM By: Curtis Sites Entered By: Curtis Sites on 08/12/2015 14:08:06 Colleen Mccarthy ([id number]) -------------------------------------------------------------------------------- Wound Assessment Details Patient Name: Colleen Mccarthy Date of Service: 08/12/2015 12:45 PM Medical Record Number: [id number] Patient Account Number: [id number] Date of Birth/Sex: 1996-02-26 (19 y.o. Female) Treating RN: Curtis Sites Primary Care Physician: PATIENT, NO Other Clinician: Referring Physician: Martie Round Treating Physician/Extender: BURNS III, WALTER Weeks in Treatment: 0 Wound Status Wound Number: 1 Primary Etiology: Trauma, Other Wound Location: Left Lower Leg - Lateral Wound Status: Open Wounding Event: Bite Date Acquired: 07/03/2015 Weeks Of Treatment: 0 Clustered Wound: No Photos Photo Uploaded By: Curtis Sites on 08/12/2015 14:59:05 Wound Measurements Length: (cm) 3.2 Width: (cm) 6.1 Depth: (cm) 0.4 Area: (cm) 15.331 Volume: (cm) 6.132 % Reduction in Area: % Reduction in Volume: Epithelialization: None Tunneling: No Undermining: No Wound Description Full Thickness Without Exposed Classification: Support Structures Wound Margin: Flat and Intact Exudate Large Amount: Exudate Type: Serosanguineous Exudate Color: red, brown Foul Odor After Cleansing: No Wound Bed Granulation Amount: Large (67-100%) Exposed Structure Granulation Quality: Red Fascia Exposed: No Necrotic Amount: Small (1-33%) Fat Layer Exposed: No Raatz, Rossie Muskrat ([id number]) Necrotic Quality: Adherent Slough Tendon  Exposed: No Muscle Exposed: No Joint Exposed: No Bone Exposed: No Limited to Skin Breakdown Periwound Skin Texture Texture Color No Abnormalities Noted: No No Abnormalities Noted: No Callus: No Atrophie Blanche: No Crepitus: No Cyanosis: No Excoriation: No Ecchymosis:  No Fluctuance: No Erythema: No Friable: No Hemosiderin Staining: No Induration: No Mottled: No Localized Edema: No Pallor: No Rash: No Rubor: No Scarring: No Temperature / Pain Moisture Tenderness on Palpation: Yes No Abnormalities Noted: No Dry / Scaly: No Maceration: No Moist: Yes Wound Preparation Ulcer Cleansing: Rinsed/Irrigated with Saline Topical Anesthetic Applied: Other: lidocaine 4%, Treatment Notes Wound #1 (Left, Lateral Lower Leg) 1. Cleansed with: Clean wound with Normal Saline 2. Anesthetic Topical Lidocaine 4% cream to wound bed prior to debridement 4. Dressing Applied: Aquacel Ag 5. Secondary Dressing Applied ABD and Kerlix/Conform 7. Secured with Secretary/administratorTape Electronic Signature(s) Signed: 08/12/2015 5:05:32 PM By: Curtis Sitesorthy, Joanna Entered By: Curtis Sitesorthy, Joanna on 08/12/2015 13:12:29 Colleen MeliaBAILEY, Shakerra ([id number]) -------------------------------------------------------------------------------- Vitals Details Patient Name: Colleen MeliaBAILEY, Carleta Date of Service: 08/12/2015 12:45 PM Medical Record Number: [id number] Patient Account Number: [id number] Date of Birth/Sex: 04/22/1996 (19 y.o. Female) Treating RN: Curtis Sitesorthy, Joanna Primary Care Physician: PATIENT, NO Other Clinician: Referring Physician: Martie RoundSPENCER, NICOLE Treating Physician/Extender: BURNS III, WALTER Weeks in Treatment: 0 Vital Signs Time Taken: 13:03 Temperature (F): 98.2 Height (in): 66 Pulse (bpm): 76 Source: Stated Respiratory Rate (breaths/min): 18 Weight (lbs): 135 Blood Pressure (mmHg): 118/83 Source: Stated Reference Range: 80 - 120 mg / dl Body Mass Index (BMI): 21.8 Electronic Signature(s) Signed: 08/12/2015 5:05:32 PM  By: Curtis Sitesorthy, Joanna Entered By: Curtis Sitesorthy, Joanna on 08/12/2015 13:03:58

## 2015-08-17 LAB — WOUND CULTURE

## 2015-08-19 ENCOUNTER — Ambulatory Visit: Payer: Medicaid Other | Admitting: Surgery

## 2015-08-26 ENCOUNTER — Ambulatory Visit: Payer: Medicaid Other | Admitting: Surgery

## 2015-09-02 ENCOUNTER — Encounter: Payer: Medicaid Other | Attending: Surgery | Admitting: Surgery

## 2015-09-02 DIAGNOSIS — W540XXD Bitten by dog, subsequent encounter: Secondary | ICD-10-CM | POA: Insufficient documentation

## 2015-09-02 DIAGNOSIS — S81812D Laceration without foreign body, left lower leg, subsequent encounter: Secondary | ICD-10-CM | POA: Diagnosis not present

## 2015-09-02 DIAGNOSIS — F17210 Nicotine dependence, cigarettes, uncomplicated: Secondary | ICD-10-CM | POA: Insufficient documentation

## 2015-09-02 DIAGNOSIS — R600 Localized edema: Secondary | ICD-10-CM | POA: Insufficient documentation

## 2015-09-02 DIAGNOSIS — S81802D Unspecified open wound, left lower leg, subsequent encounter: Secondary | ICD-10-CM | POA: Insufficient documentation

## 2015-09-02 DIAGNOSIS — L97822 Non-pressure chronic ulcer of other part of left lower leg with fat layer exposed: Secondary | ICD-10-CM | POA: Diagnosis not present

## 2015-09-03 NOTE — Progress Notes (Signed)
Colleen MeliaBAILEY, Malynda ([id number]) Visit Report for 09/02/2015 Chief Complaint Document Details Patient Name: Colleen MeliaBAILEY, Sheree Date of Service: 09/02/2015 1:30 PM Medical Record Number: [id number] Patient Account Number: [id number] Date of Birth/Sex: 12/06/1995 (19 y.o. Female) Treating RN: Afful, RN, BSN, Kirby Sinkita Primary Care Physician: PATIENT, NO Other Clinician: Referring Physician: Martie RoundSPENCER, NICOLE Treating Physician/Extender: BURNS III, Heidi Maclin Weeks in Treatment: 3 Information Obtained from: Patient Chief Complaint Left leg ulceration status post dog bite Electronic Signature(s) Signed: 09/02/2015 2:46:32 PM By: Madelaine BhatBurns, III, Lache Dagher MD Entered By: Madelaine BhatBurns, III, Meral Geissinger on 09/02/2015 14:28:39 Colleen MeliaBAILEY, Malai ([id number]) -------------------------------------------------------------------------------- HPI Details Patient Name: Colleen MeliaBAILEY, Maurene Date of Service: 09/02/2015 1:30 PM Medical Record Number: [id number] Patient Account Number: [id number] Date of Birth/Sex: 03/14/1996 (19 y.o. Female) Treating RN: Afful, RN, BSN, [employer] Primary Care Physician: PATIENT, NO Other Clinician: Referring Physician: Martie RoundSPENCER, NICOLE Treating Physician/Extender: BURNS III, Macyn Shropshire Weeks in Treatment: 3 History of Present Illness HPI Description: Pleasant 19 year old with no significant past medical history except for smoking. She was bitten by a dog on 07/03/2015. She suffered puncture wounds and a laceration to her left lateral calf. The laceration was sutured. The patient said that when the sutures were removed 2 weeks later the wound dehisced. She is taking Augmentin. Biopsy culture grew Escherichia coli resistant to ampicillin, sensitive to Cipro. Performing dressing changes with silver alginate. Intermittently using Tubigrip for edema control. She is able to bear weight but has been using crutches for ambulation. She reports slow improvement. Moderate pain with pressure. No ischemic rest pain or claudication. No fever or  chills. Moderate serosanguineous drainage. Electronic Signature(s) Signed: 09/02/2015 2:46:32 PM By: Madelaine BhatBurns, III, Maki Sweetser MD Entered By: Madelaine BhatBurns, III, Shantele Reller on 09/02/2015 14:29:30 Colleen MeliaBAILEY, Trinady ([id number]) -------------------------------------------------------------------------------- Physical Exam Details Patient Name: Colleen MeliaBAILEY, Shabreka Date of Service: 09/02/2015 1:30 PM Medical Record Number: [id number] Patient Account Number: [id number] Date of Birth/Sex: 11/30/1995 (19 y.o. Female) Treating RN: Clover MealyAfful, RN, BSN, [employer] Primary Care Physician: PATIENT, NO Other Clinician: Referring Physician: Martie RoundSPENCER, NICOLE Treating Physician/Extender: BURNS III, Colie Josten Weeks in Treatment: 3 Constitutional . Pulse regular. Respirations normal and unlabored. Afebrile. . Notes Left lateral calf ulceration. Full-thickness. Healthy granulating base. Biofilm debrided with gauze. No exposed deep structures. Localized edema. No significant cellulitis. Moderately tender. Palpable DP. No ABIs obtained. Electronic Signature(s) Signed: 09/02/2015 2:46:32 PM By: Madelaine BhatBurns, III, Frankie Zito MD Entered By: Madelaine BhatBurns, III, Damonta Cossey on 09/02/2015 14:30:05 Colleen MeliaBAILEY, Braelynne ([id number]) -------------------------------------------------------------------------------- Physician Orders Details Patient Name: Colleen MeliaBAILEY, Kada Date of Service: 09/02/2015 1:30 PM Medical Record Number: [id number] Patient Account Number: [id number] Date of Birth/Sex: 12/20/1995 (19 y.o. Female) Treating RN: Afful, RN, BSN, [employer] Primary Care Physician: PATIENT, NO Other Clinician: Referring Physician: Martie RoundSPENCER, NICOLE Treating Physician/Extender: BURNS III, Regis BillWALTER Weeks in Treatment: 3 Verbal / Phone Orders: Yes Clinician: Afful, RN, BSN, Rita Read Back and Verified: Yes Diagnosis Coding Wound Cleansing Wound #1 Left,Lateral Lower Leg o Clean wound with Normal Saline. Anesthetic Wound #1 Left,Lateral Lower Leg o Topical Lidocaine 4% cream applied to wound bed  prior to debridement Primary Wound Dressing Wound #1 Left,Lateral Lower Leg o Aquacel Ag Secondary Dressing Wound #1 Left,Lateral Lower Leg o ABD and Kerlix/Conform Dressing Change Frequency Wound #1 Left,Lateral Lower Leg o Change dressing every other day. Follow-up Appointments Wound #1 Left,Lateral Lower Leg o Return Appointment in 1 week. Edema Control Wound #1 Left,Lateral Lower Leg o Elevate legs to the level of the heart and pump ankles as often as possible o Tubigrip Additional Orders / Instructions Wound #1 Left,Lateral Lower Leg o Stop Smoking o Increase protein  intake. BERDENA, CISEK ([id number]) Medications-please add to medication list. Wound #1 Left,Lateral Lower Leg o P.O. Antibiotics - Cipro  bid x 1week Patient Medications Allergies: Vicodin Notifications Medication Indication Start End Cipro wound infection 09/02/2015 DOSE oral 500 mg tablet - tablet oral Electronic Signature(s) Signed: 09/02/2015 2:00:01 PM By: Elpidio Eric BSN, RN Signed: 09/02/2015 2:46:32 PM By: Madelaine Bhat MD Previous Signature: 09/02/2015 1:40:54 PM Version By: Elpidio Eric BSN, RN Entered By: Elpidio Eric on 09/02/2015 14:00:01 CRYSTALINA, STODGHILL ([id number]) -------------------------------------------------------------------------------- Prescription 09/02/2015 Patient Name: Colleen Mccarthy Physician: Madelaine Bhat MD Date of Birth: 1996-01-03 NPI#: [id number] Sex: F DEA#: Phone #: [phone] License #: Patient Address: [facility] [address] [place], [place] [address] [facility] [address] [place], [place] [address] [phone] Allergies Vicodin Severity: Severe Physician's Orders P.O. Antibiotics - Cipro  bid x 1week Signature(s): Date(s): MICHALINE, KINDIG ([id number]) Prescription 09/02/2015 Patient Name: Colleen Mccarthy Physician: Madelaine Bhat MD Date of Birth:  Apr 09, 1996 NPI#: [id number] Sex: F DEA#: Phone #: [phone] License #: Patient Address: [facility] [address] [place], [place] [address] [facility] [address] [place], [place] [address] [phone] Allergies Vicodin Severity: Severe Medication Medication: Route: Strength: Form: Cipro oral 500 mg tablet Class: QUINOLONES Dose: Frequency / Time: Indication: tablet oral wound infection Number of Refills: Number of Units: 0 Generic Substitution: Start Date: End Date: Administered at Substitution Permitted 09/02/2015 Facility: No Note to Pharmacy: Signature(s): Date(s): EXIE, CHRISMER ([id number]) Electronic Signature(s) Signed: 09/02/2015 2:11:52 PM By: Elpidio Eric BSN, RN Signed: 09/02/2015 2:46:32 PM By: Madelaine Bhat MD Entered By: Elpidio Eric on 09/02/2015 14:00:01 Colleen Mccarthy ([id number]) --------------------------------------------------------------------------------  Problem List Details Patient Name: Colleen Mccarthy Date of Service: 09/02/2015 1:30 PM Medical Record Number: [id number] Patient Account Number: [id number] Date of Birth/Sex: 20-Mar-1996 (19 y.o. 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 Female) Treating RN: Afful, RN, BSN, [place] Sink Primary Care Physician: PATIENT, NO Other Clinician: Referring Physician: Martie Round Treating Physician/Extender: BURNS III, Constantine Ruddick Weeks in Treatment: 3 Subjective Chief Complaint Information obtained from Patient Left leg ulceration status post dog bite History of Present Illness (HPI) Pleasant 19 year old with no significant past medical history except for smoking. She was bitten by a dog on 07/03/2015. She suffered puncture wounds and a laceration to her left lateral calf. The laceration was sutured. The patient said that when the sutures were removed 2 weeks later the wound dehisced. She is taking Augmentin. Biopsy culture grew Escherichia coli resistant to ampicillin, sensitive to Cipro. Performing dressing changes with silver alginate. Intermittently using Tubigrip for edema control. She is able to bear weight but has been using  crutches for ambulation. She reports slow improvement. Moderate pain with pressure. No ischemic rest pain or claudication. No fever or chills. Moderate serosanguineous drainage. Objective Constitutional Pulse regular. Respirations normal and unlabored. Afebrile. Vitals Time Taken: 1:26 PM, Height: 66 in, Weight: 135 lbs, BMI: 21.8, Temperature: 98.1 F, Pulse: 89 bpm, Respiratory Rate: 18 breaths/min, Blood Pressure: 126/105 mmHg. General Notes: Left lateral calf ulceration. Full-thickness. Healthy granulating base. Biofilm debrided with gauze. No exposed deep structures. Localized edema. No  significant cellulitis. Moderately tender. Palpable DP. No ABIs obtained. Integumentary (Hair, Skin) Wound #1 status is Open. Original cause of wound was Bite. The wound is located on the Left,Lateral Lower Leg. The wound measures 1.5cm length x 4.5cm width x 0.3cm depth; 5.301cm^2 area and 1.59cm^3 volume. The wound is limited to skin breakdown. There is no tunneling noted. There is a large amount of serosanguineous drainage noted. The wound margin is flat and intact. There is large (67-100%) Dukeman, Maryela ([id number]) red granulation within the wound bed. There is a small (1-33%) amount of necrotic tissue within the wound bed including Adherent Slough. The periwound skin appearance exhibited: Localized Edema, Moist. The periwound skin appearance did not exhibit: Callus, Crepitus, Excoriation, Fluctuance, Friable, Induration, Rash, Scarring, Dry/Scaly, Maceration, Atrophie Blanche, Cyanosis, Ecchymosis, Hemosiderin Staining, Mottled, Pallor, Rubor, Erythema. The periwound has tenderness on palpation. 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 Plan Wound Cleansing: Wound #1 Left,Lateral Lower Leg: Clean wound with Normal Saline. Anesthetic: Wound #1 Left,Lateral Lower Leg: Topical Lidocaine 4% cream applied to wound bed prior to debridement Primary Wound Dressing: Wound #1 Left,Lateral Lower Leg: Aquacel Ag Secondary Dressing: Wound #1 Left,Lateral Lower Leg: ABD and Kerlix/Conform Dressing Change Frequency: Wound #1 Left,Lateral Lower Leg: Change dressing every other day. Follow-up Appointments: Wound #1 Left,Lateral Lower  Leg: Return Appointment in 1 week. Edema Control: Wound #1 Left,Lateral Lower Leg: AREESHA, DEHAVEN ([id number]) Elevate legs to the level of the heart and pump ankles as often as possible Tubigrip Additional Orders / Instructions: Wound #1 Left,Lateral Lower Leg: Stop Smoking Increase protein intake. Medications-please add to medication list.: Wound #1 Left,Lateral Lower Leg: P.O. Antibiotics - Cipro  bid x 1week The following medication(s) was prescribed: Cipro oral 500 mg tablet tablet oral for wound infection starting 09/02/2015 Continue with silver alginate. Tubigrip for edema control. Cipro o1 week. Electronic Signature(s) Signed: 09/02/2015 2:46:32 PM By: Madelaine Bhat MD Entered By: Madelaine Bhat on 09/02/2015 14:30:31 Colleen Mccarthy ([id number]) -------------------------------------------------------------------------------- SuperBill Details Patient Name: Colleen Mccarthy Date of Service: 09/02/2015 Medical Record Number: [id number] Patient Account Number: [id number] Date of Birth/Sex: 23-Nov-1995 (19 y.o. Female) Treating RN: Afful, RN, BSN, Psychologist, clinical Primary Care Physician: PATIENT, NO Other Clinician: Referring Physician: Martie Round Treating Physician/Extender: BURNS III, Jamyria Ozanich Weeks in Treatment: 3 Diagnosis Coding ICD-10 Codes Code Description S81.802D Unspecified open wound, left lower leg, subsequent encounter S81.812D Laceration without foreign body, left lower leg, subsequent encounter L97.822 Non-pressure chronic ulcer of other part of left lower leg with fat layer exposed F17.210 Nicotine dependence, cigarettes, uncomplicated R60.0 Localized edema Facility Procedures CPT4 Code: [id number] Description: 95188 - WOUND CARE VISIT-LEV 2 EST PT Modifier: Quantity: 1 Physician Procedures CPT4: Description Modifier Quantity Code [id number] 99213 - WC PHYS LEVEL  3 - EST PT 1 ICD-10 Description Diagnosis S81.802D Unspecified open wound, left lower leg,  subsequent encounter S81.812D Laceration without foreign body, left lower leg, subsequent  encounter L97.822 Non-pressure chronic ulcer of other part of left lower leg with fat layer exposed Electronic Signature(s) Signed: 09/02/2015 2:46:32 PM By: Madelaine Bhat MD Entered By: Madelaine Bhat on 09/02/2015 14:30:56

## 2015-09-03 NOTE — Progress Notes (Addendum)
Colleen Mccarthy, Colleen Mccarthy ([id number]) Visit Report for 09/02/2015 Arrival Information Details Patient Name: Colleen Mccarthy, Colleen Mccarthy Date of Service: 09/02/2015 1:30 PM Medical Record Number: [id number] Patient Account Number: [id number] Date of Birth/Sex: 07/23/1996 (19 y.o. Female) Treating RN: Afful, RN, BSN, Psychologist, clinicalita Primary Care Physician: PATIENT, NO Other Clinician: Referring Physician: Martie RoundSPENCER, NICOLE Treating Physician/Extender: BURNS III, WALTER Weeks in Treatment: 3 Visit Information History Since Last Visit Added or deleted any medications: No Patient Arrived: Ambulatory Any new allergies or adverse reactions: No Arrival Time: 13:24 Had a fall or experienced change in No Accompanied By: self activities of daily living that may affect Transfer Assistance: None risk of falls: Patient Identification Verified: Yes Signs or symptoms of abuse/neglect since last No Secondary Verification Process Yes visito Completed: Hospitalized since last visit: No Has Dressing in Place as Prescribed: Yes Pain Present Now: No Electronic Signature(s) Signed: 09/02/2015 1:24:48 PM By: Elpidio EricAfful, Rita BSN, RN Entered By: Elpidio EricAfful, Rita on 09/02/2015 13:24:48 Colleen Mccarthy, Colleen Mccarthy ([id number]) -------------------------------------------------------------------------------- Clinic Level of Care Assessment Details Patient Name: Colleen Mccarthy, Colleen Mccarthy Date of Service: 09/02/2015 1:30 PM Medical Record Number: [id number] Patient Account Number: [id number] Date of Birth/Sex: 12/18/1995 (19 y.o. Female) Treating RN: Afful, RN, BSN, Psychologist, clinicalita Primary Care Physician: PATIENT, NO Other Clinician: Referring Physician: Martie RoundSPENCER, NICOLE Treating Physician/Extender: BURNS III, WALTER Weeks in Treatment: 3 Clinic Level of Care Assessment Items TOOL 4 Quantity Score []  - Use when only an EandM is performed on FOLLOW-UP visit 0 ASSESSMENTS - Nursing Assessment / Reassessment X - Reassessment of Co-morbidities (includes updates in patient status) 1 10 X -  Reassessment of Adherence to Treatment Plan 1 5 ASSESSMENTS - Wound and Skin Assessment / Reassessment X - Simple Wound Assessment / Reassessment - one wound 1 5 []  - Complex Wound Assessment / Reassessment - multiple wounds 0 []  - Dermatologic / Skin Assessment (not related to wound area) 0 ASSESSMENTS - Focused Assessment []  - Circumferential Edema Measurements - multi extremities 0 []  - Nutritional Assessment / Counseling / Intervention 0 X - Lower Extremity Assessment (monofilament, tuning fork, pulses) 1 5 []  - Peripheral Arterial Disease Assessment (using hand held doppler) 0 ASSESSMENTS - Ostomy and/or Continence Assessment and Care []  - Incontinence Assessment and Management 0 []  - Ostomy Care Assessment and Management (repouching, etc.) 0 PROCESS - Coordination of Care []  - Simple Patient / Family Education for ongoing care 0 []  - Complex (extensive) Patient / Family Education for ongoing care 0 []  - Staff obtains ChiropractorConsents, Records, Test Results / Process Orders 0 []  - Staff telephones HHA, Nursing Homes / Clarify orders / etc 0 []  - Routine Transfer to another Facility (non-emergent condition) 0 Colleen Mccarthy, Colleen Mccarthy ([id number]) []  - Routine Hospital Admission (non-emergent condition) 0 []  - New Admissions / Manufacturing engineernsurance Authorizations / Ordering NPWT, Apligraf, etc. 0 []  - Emergency Hospital Admission (emergent condition) 0 X - Simple Discharge Coordination 1 10 []  - Complex (extensive) Discharge Coordination 0 PROCESS - Special Needs []  - Pediatric / Minor Patient Management 0 []  - Isolation Patient Management 0 []  - Hearing / Language / Visual special needs 0 []  - Assessment of Community assistance (transportation, D/C planning, etc.) 0 []  - Additional assistance / Altered mentation 0 []  - Support Surface(s) Assessment (bed, cushion, seat, etc.) 0 INTERVENTIONS - Wound Cleansing / Measurement X - Simple Wound Cleansing - one wound 1 5 []  - Complex Wound Cleansing - multiple wounds  0 []  - Wound Imaging (photographs - any number of wounds) 0 []  - Wound Tracing (instead of photographs) 0 []  -  Simple Wound Measurement - one wound 0 []  - Complex Wound Measurement - multiple wounds 0 INTERVENTIONS - Wound Dressings X - Small Wound Dressing one or multiple wounds 1 10 []  - Medium Wound Dressing one or multiple wounds 0 []  - Large Wound Dressing one or multiple wounds 0 []  - Application of Medications - topical 0 []  - Application of Medications - injection 0 INTERVENTIONS - Miscellaneous []  - External ear exam 0 Colleen, Mccarthy ([id number]) []  - Specimen Collection (cultures, biopsies, blood, body fluids, etc.) 0 []  - Specimen(s) / Culture(s) sent or taken to Lab for analysis 0 []  - Patient Transfer (multiple staff / Michiel Sites Lift / Similar devices) 0 []  - Simple Staple / Suture removal (25 or less) 0 []  - Complex Staple / Suture removal (26 or more) 0 []  - Hypo / Hyperglycemic Management (close monitor of Blood Glucose) 0 []  - Ankle / Brachial Index (ABI) - do not check if billed separately 0 X - Vital Signs 1 5 Has the patient been seen at the hospital within the last three years: Yes Total Score: 55 Level Of Care: New/Established - Level 2 Electronic Signature(s) Signed: 09/02/2015 1:41:33 PM By: Elpidio Eric BSN, RN Entered By: Elpidio Eric on 09/02/2015 13:41:32 Colleen Mccarthy ([id number]) -------------------------------------------------------------------------------- Encounter Discharge Information Details Patient Name: Colleen Mccarthy Date of Service: 09/02/2015 1:30 PM Medical Record Number: [id number] Patient Account Number: [id number] Date of Birth/Sex: 03-04-1996 (19 y.o. Female) Treating RN: Afful, RN, BSN, [place] [employer] Primary Care Physician: PATIENT, NO Other Clinician: Referring Physician: Martie Round Treating Physician/Extender: BURNS III, WALTER Weeks in Treatment: 3 Encounter Discharge Information Items Discharge Pain Level: 0 Discharge Condition:  Stable Ambulatory Status: Ambulatory Discharge Destination: Home Transportation: Private Auto Accompanied By: self Schedule Follow-up Appointment: No Medication Reconciliation completed and provided to Patient/Care No Emmaline Wahba: Provided on Clinical Summary of Care: 09/02/2015 Form Type Recipient Paper Patient JB Electronic Signature(s) Signed: 09/02/2015 1:56:07 PM By: Elpidio Eric BSN, RN Previous Signature: 09/02/2015 1:47:10 PM Version By: Gwenlyn Perking Entered By: Elpidio Eric on 09/02/2015 13:56:06 Colleen Mccarthy ([id number]) -------------------------------------------------------------------------------- Lower Extremity Assessment Details Patient Name: Colleen Mccarthy Date of Service: 09/02/2015 1:30 PM Medical Record Number: [id number] Patient Account Number: [id number] Date of Birth/Sex: 19-Jul-1996 (19 y.o. Female) Treating RN: Afful, RN, BSN, Psychologist, clinical Primary Care Physician: PATIENT, NO Other Clinician: Referring Physician: Martie Round Treating Physician/Extender: BURNS III, WALTER Weeks in Treatment: 3 Edema Assessment Assessed: [Left: No] [Right: No] E[Left: dema] [Right: :] Calf Left: Right: Point of Measurement: 32 cm From Medial Instep 33.2 cm cm Ankle Left: Right: Point of Measurement: 10 cm From Medial Instep 23.4 cm cm Vascular Assessment Claudication: Claudication Assessment [Left:None] Pulses: Posterior Tibial Dorsalis Pedis Palpable: [Left:Yes] Extremity colors, hair growth, and conditions: Extremity Color: [Left:Normal] Hair Growth on Extremity: [Left:Yes] Temperature of Extremity: [Left:Warm] Capillary Refill: [Left:< 3 seconds] Toe Nail Assessment Left: Right: Thick: No Discolored: No Deformed: No Improper Length and Hygiene: No Electronic Signature(s) Signed: 09/02/2015 1:27:13 PM By: Elpidio Eric BSN, RN Entered By: Elpidio Eric on 09/02/2015 13:27:13 Colleen Mccarthy, Colleen Mccarthy ([id number] Mccarthy  ([id number]) -------------------------------------------------------------------------------- Multi Wound Chart Details Patient Name: Colleen Mccarthy Date of Service: 09/02/2015 1:30 PM Medical Record Number: [id number] Patient Account Number: [id number] Date of Birth/Sex: May 31, 1996 (19 y.o. Female) Treating RN: Afful, RN, BSN, [employer] Primary Care Physician: PATIENT, NO Other Clinician: Referring Physician: Martie Round Treating Physician/Extender: BURNS III, WALTER Weeks in Treatment: 3 Vital Signs Height(in): 66 Pulse(bpm): 89 Weight(lbs): 135 Blood Pressure 126/105 (mmHg): Body Mass Index(BMI): 22 Temperature(F): 98.1 Respiratory Rate  18 (breaths/min): Photos: [1:No Photos] [N/A:N/A] Wound Location: [1:Left Lower Leg - Lateral] [N/A:N/A] Wounding Event: [1:Bite] [N/A:N/A] Primary Etiology: [1:Trauma, Other] [N/A:N/A] Date Acquired: [1:07/03/2015] [N/A:N/A] Weeks of Treatment: [1:3] [N/A:N/A] Wound Status: [1:Open] [N/A:N/A] Measurements L x W x D 1.5x4.5x0.3 [N/A:N/A] (cm) Area (cm) : [1:5.301] [N/A:N/A] Volume (cm) : [1:1.59] [N/A:N/A] % Reduction in Area: [1:65.40%] [N/A:N/A] % Reduction in Volume: 74.10% [N/A:N/A] Classification: [1:Full Thickness Without Exposed Support Structures] [N/A:N/A] Exudate Amount: [1:Large] [N/A:N/A] Exudate Type: [1:Serosanguineous] [N/A:N/A] Exudate Color: [1:red, brown] [N/A:N/A] Wound Margin: [1:Flat and Intact] [N/A:N/A] Granulation Amount: [1:Large (67-100%)] [N/A:N/A] Granulation Quality: [1:Red] [N/A:N/A] Necrotic Amount: [1:Small (1-33%)] [N/A:N/A] Exposed Structures: [1:Fascia: No Fat: No Tendon: No Muscle: No Joint: No Bone: No] [N/A:N/A] Limited to Skin Breakdown Epithelialization: Small (1-33%) N/A N/A Periwound Skin Texture: Edema: Yes N/A N/A Excoriation: No Induration: No Callus: No Crepitus: No Fluctuance: No Friable: No Rash: No Scarring: No Periwound Skin Moist: Yes N/A N/A Moisture: Maceration:  No Dry/Scaly: No Periwound Skin Color: Atrophie Blanche: No N/A N/A Cyanosis: No Ecchymosis: No Erythema: No Hemosiderin Staining: No Mottled: No Pallor: No Rubor: No Tenderness on Yes N/A N/A Palpation: Wound Preparation: Ulcer Cleansing: N/A N/A Rinsed/Irrigated with Saline Topical Anesthetic Applied: Other: lidocaine 4% Treatment Notes Electronic Signature(s) Signed: 09/02/2015 1:37:48 PM By: Elpidio Eric BSN, RN Entered By: Elpidio Eric on 09/02/2015 13:37:48 Colleen Mccarthy ([id number]) -------------------------------------------------------------------------------- Multi-Disciplinary Care Plan Details Patient Name: Colleen Mccarthy Date of Service: 09/02/2015 1:30 PM Medical Record Number: [id number] Patient Account Number: [id number] Date of Birth/Sex: October 03, 1995 (19 y.o. Female) Treating RN: Clover Mealy, RN, BSN, [employer] Primary Care Physician: PATIENT, NO Other Clinician: Referring Physician: Martie Round Treating Physician/Extender: BURNS III, Regis Bill in Treatment: 3 Active Inactive Electronic Signature(s) Signed: 10/29/2015 8:49:54 AM By: Elpidio Eric BSN, RN Previous Signature: 09/02/2015 1:37:14 PM Version By: Elpidio Eric BSN, RN Entered By: Elpidio Eric on 10/29/2015 08:49:54 Colleen Mccarthy ([id number]) -------------------------------------------------------------------------------- Pain Assessment Details Patient Name: Colleen Mccarthy Date of Service: 09/02/2015 1:30 PM Medical Record Number: [id number] Patient Account Number: [id number] Date of Birth/Sex: 10-Apr-1996 (19 y.o. Female) Treating RN: Afful, RN, BSN, [place] [employer] Primary Care Physician: PATIENT, NO Other Clinician: Referring Physician: Martie Round Treating Physician/Extender: BURNS III, WALTER Weeks in Treatment: 3 Active Problems Location of Pain Severity and Description of Pain Patient Has Paino No Site Locations Pain Management and Medication Current Pain Management: Electronic Signature(s) Signed:  09/02/2015 1:24:57 PM By: Elpidio Eric BSN, RN Entered By: Elpidio Eric on 09/02/2015 13:24:57 Colleen Mccarthy ([id number]) -------------------------------------------------------------------------------- Patient/Caregiver Education Details Patient Name: Colleen Mccarthy Date of Service: 09/02/2015 1:30 PM Medical Record Number: [id number] Patient Account Number: [id number] Date of Birth/Gender: 05-27-1996 (19 y.o. Female) Treating RN: Clover Mealy, RN, BSN, [employer] Primary Care Physician: PATIENT, NO Other Clinician: Referring Physician: Martie Round Treating Physician/Extender: BURNS III, Regis Bill in Treatment: 3 Education Assessment Education Provided To: Patient Education Topics Provided [facility]: Methods: Explain/Verbal Responses: State content correctly Electronic Signature(s) Signed: 09/02/2015 1:56:19 PM By: Elpidio Eric BSN, RN Entered By: Elpidio Eric on 09/02/2015 13:56:18 Colleen Mccarthy ([id number]) -------------------------------------------------------------------------------- Wound Assessment Details Patient Name: Colleen Mccarthy Date of Service: 09/02/2015 1:30 PM Medical Record Number: [id number] Patient Account Number: [id number] Date of Birth/Sex: 17-Apr-1996 (19 y.o. Female) Treating RN: Afful, RN, BSN, [employer] Primary Care Physician: PATIENT, NO Other Clinician: Referring Physician: Martie Round Treating Physician/Extender: BURNS III, WALTER Weeks in Treatment: 3 Wound Status Wound Number: 1 Primary Etiology: Trauma, Other Wound Location: Left Lower Leg - Lateral Wound Status: Open Wounding Event: Bite Date Acquired:  07/03/2015 Weeks Of Treatment: 3 Clustered Wound: No Photos Photo Uploaded By: Elpidio Eric on 09/02/2015 14:09:35 Wound Measurements Length: (cm) 1.5 Width: (cm) 4.5 Depth: (cm) 0.3 Area: (cm) 5.301 Volume: (cm) 1.59 % Reduction in Area: 65.4% % Reduction in Volume: 74.1% Epithelialization: Small (1-33%) Tunneling: No Wound  Description Full Thickness Without Exposed Classification: Support Structures Wound Margin: Flat and Intact Exudate Large Amount: Colleen Mccarthy, Colleen Mccarthy ([id number]) Foul Odor After Cleansing: No Exudate Type: Serosanguineous Exudate Color: red, brown Wound Bed Granulation Amount: Large (67-100%) Exposed Structure Granulation Quality: Red Fascia Exposed: No Necrotic Amount: Small (1-33%) Fat Layer Exposed: No Necrotic Quality: Adherent Slough Tendon Exposed: No Muscle Exposed: No Joint Exposed: No Bone Exposed: No Limited to Skin Breakdown Periwound Skin Texture Texture Color No Abnormalities Noted: No No Abnormalities Noted: No Callus: No Atrophie Blanche: No Crepitus: No Cyanosis: No Excoriation: No Ecchymosis: No Fluctuance: No Erythema: No Friable: No Hemosiderin Staining: No Induration: No Mottled: No Localized Edema: Yes Pallor: No Rash: No Rubor: No Scarring: No Temperature / Pain Moisture Tenderness on Palpation: Yes No Abnormalities Noted: No Dry / Scaly: No Maceration: No Moist: Yes Wound Preparation Ulcer Cleansing: Rinsed/Irrigated with Saline Topical Anesthetic Applied: Other: lidocaine 4%, Electronic Signature(s) Signed: 09/02/2015 1:33:36 PM By: Elpidio Eric BSN, RN Entered By: Elpidio Eric on 09/02/2015 13:33:36 Colleen Mccarthy ([id number]) -------------------------------------------------------------------------------- Vitals Details Patient Name: Colleen Mccarthy Date of Service: 09/02/2015 1:30 PM Medical Record Number: [id number] Patient Account Number: [id number] Date of Birth/Sex: Dec 14, 1995 (19 y.o. Female) Treating RN: Afful, RN, BSN, Psychologist, clinical Primary Care Physician: PATIENT, NO Other Clinician: Referring Physician: Martie Round Treating Physician/Extender: BURNS III, WALTER Weeks in Treatment: 3 Vital Signs Time Taken: 13:26 Temperature (F): 98.1 Height (in): 66 Pulse (bpm): 89 Weight (lbs): 135 Respiratory Rate (breaths/min): 18 Body  Mass Index (BMI): 21.8 Blood Pressure (mmHg): 126/105 Reference Range: 80 - 120 mg / dl Electronic Signature(s) Signed: 09/02/2015 1:27:27 PM By: Elpidio Eric BSN, RN Previous Signature: 09/02/2015 1:26:40 PM Version By: Elpidio Eric BSN, RN Entered By: Elpidio Eric on 09/02/2015 13:27:27

## 2015-09-09 ENCOUNTER — Ambulatory Visit: Payer: Medicaid Other | Admitting: Surgery

## 2019-09-27 NOTE — L&D Delivery Note (Signed)
Delivery Note  Colleen Mccarthy is a [id number] at [redacted]w[redacted]d with an LMP of 07/27/2019, inconsistent with [place] at [redacted]w[redacted]d.   First Stage: Labor onset: 0000 05/14/2020 Augmentation : None Analgesia /Anesthesia intrapartum: None AROM at 1027 with delivery  Second Stage: Complete dilation at 1022 Onset of pushing at 1022 FHR second stage 145bpm - intermittent doppler performed d/t precipitous labor   Colleen Mccarthy presented to L&D in active labor.  She progressed quickly to C/C/+1 with visible bulging membranes.  She had a spontaneous urge to push in hands and knees.  AROM was performed right before delivery for meconium stained fluid.   Delivery of a viable baby boy on 05/14/2020 at 1027 by CNM Delivery of fetal head in OA position with restitution to ROT. No nuchal cord;  Anterior then posterior shoulders delivered easily with gentle downward traction. Baby was passed under mom and assisted to maternal abd.  Both mother and baby were assisted with repositioning so that baby could rest skin to skin on maternal abdomen. Baby was attended to by Ambulance person.  Cord double clamped after cessation of pulsation, cut by grandmother   Cord blood sample collected: N/A - A pos   Third Stage: Oxytocin administered IM after delivery of infant for hemorrhage prophylaxis  Placenta delivered intact with 3 VC @ 1041 Placenta disposition: discarded Uterine tone firm / bleeding moderate  No laceration identified  Anesthesia for repair: N/A Repair N/A Est. Blood Loss (mL): 450 ml   Complications: Precipitous birth, GBS positive, not treated   Mom to postpartum.  Baby to Couplet care / Skin to Skin.  Newborn: Live born female  Birth Weight: 6 lb 0.7 oz (2740 g) APGAR: 8, 9  Newborn Delivery   Birth date/time: 05/14/2020 10:27:00 Delivery type: Vaginal, Spontaneous    Feeding planned: formula  ---------- Margaretmary Eddy, CNM Certified Nurse Midwife [facility]

## 2019-11-24 ENCOUNTER — Other Ambulatory Visit: Payer: Self-pay | Admitting: Physician Assistant

## 2019-11-24 DIAGNOSIS — Z3490 Encounter for supervision of normal pregnancy, unspecified, unspecified trimester: Secondary | ICD-10-CM

## 2019-11-29 ENCOUNTER — Other Ambulatory Visit: Payer: Self-pay

## 2019-11-29 ENCOUNTER — Other Ambulatory Visit: Payer: Self-pay | Admitting: Physician Assistant

## 2019-11-29 ENCOUNTER — Ambulatory Visit
Admission: RE | Admit: 2019-11-29 | Discharge: 2019-11-29 | Disposition: A | Discharge status: Home / Self Care | Payer: Medicaid Other | Source: Ambulatory Visit | Attending: Physician Assistant | Admitting: Physician Assistant

## 2019-11-29 DIAGNOSIS — Z3490 Encounter for supervision of normal pregnancy, unspecified, unspecified trimester: Secondary | ICD-10-CM | POA: Insufficient documentation

## 2019-12-25 ENCOUNTER — Other Ambulatory Visit: Payer: Self-pay | Admitting: Physician Assistant

## 2019-12-25 DIAGNOSIS — Z3480 Encounter for supervision of other normal pregnancy, unspecified trimester: Secondary | ICD-10-CM

## 2020-01-23 ENCOUNTER — Other Ambulatory Visit: Payer: Self-pay

## 2020-01-23 ENCOUNTER — Observation Stay
Admission: EM | Admit: 2020-01-23 | Discharge: 2020-01-23 | Disposition: A | Discharge status: Home / Self Care | Payer: Medicaid Other

## 2020-01-23 DIAGNOSIS — O23592 Infection of other part of genital tract in pregnancy, second trimester: Secondary | ICD-10-CM | POA: Diagnosis not present

## 2020-01-23 DIAGNOSIS — O26899 Other specified pregnancy related conditions, unspecified trimester: Secondary | ICD-10-CM | POA: Diagnosis present

## 2020-01-23 DIAGNOSIS — Z885 Allergy status to narcotic agent status: Secondary | ICD-10-CM | POA: Diagnosis not present

## 2020-01-23 DIAGNOSIS — Z3A22 22 weeks gestation of pregnancy: Secondary | ICD-10-CM | POA: Insufficient documentation

## 2020-01-23 DIAGNOSIS — N898 Other specified noninflammatory disorders of vagina: Secondary | ICD-10-CM | POA: Diagnosis present

## 2020-01-23 DIAGNOSIS — F17210 Nicotine dependence, cigarettes, uncomplicated: Secondary | ICD-10-CM | POA: Diagnosis not present

## 2020-01-23 DIAGNOSIS — O99332 Smoking (tobacco) complicating pregnancy, second trimester: Secondary | ICD-10-CM | POA: Diagnosis not present

## 2020-01-23 DIAGNOSIS — B9689 Other specified bacterial agents as the cause of diseases classified elsewhere: Secondary | ICD-10-CM | POA: Diagnosis not present

## 2020-01-23 LAB — WET PREP, GENITAL
Sperm: NONE SEEN
Trich, Wet Prep: NONE SEEN
Yeast Wet Prep HPF POC: NONE SEEN

## 2020-01-23 LAB — URINALYSIS, COMPLETE (UACMP) WITH MICROSCOPIC
Bilirubin Urine: NEGATIVE
Glucose, UA: NEGATIVE mg/dL
Hgb urine dipstick: NEGATIVE
Ketones, ur: NEGATIVE mg/dL
Nitrite: NEGATIVE
Protein, ur: NEGATIVE mg/dL
Specific Gravity, Urine: 1.021 (ref 1.005–1.030)
pH: 6 (ref 5.0–8.0)

## 2020-01-23 LAB — CHLAMYDIA/NGC RT PCR (ARMC ONLY)
Chlamydia Tr: NOT DETECTED
N gonorrhoeae: DETECTED — AB

## 2020-01-23 MED ORDER — METRONIDAZOLE 500 MG PO TABS
500.0000 mg | ORAL_TABLET | Freq: Once | ORAL | Status: AC
  Administered 2020-01-23: 20:00:00 500 mg via ORAL
  Filled 2020-01-23 (×2): qty 1

## 2020-01-23 MED ORDER — METRONIDAZOLE 500 MG PO TABS: 500.0000 mg | ORAL_TABLET | Freq: Two times a day (BID) | ORAL | 0 refills | Status: AC

## 2020-01-23 MED ORDER — CALCIUM CARBONATE ANTACID 500 MG PO CHEW: 2.0000 | CHEWABLE_TABLET | ORAL | Status: DC | PRN

## 2020-01-23 NOTE — Progress Notes (Signed)
Pt given sandwich and verbalizes understanding of discharge instructions

## 2020-01-23 NOTE — Discharge Instructions (Signed)
Finish all Flagyl as prescribed Keep scheduled appointments

## 2020-01-23 NOTE — Discharge Summary (Signed)
Colleen Mccarthy is a 24 y.o. female. She is at [redacted]w[redacted]d gestation. No LMP recorded (lmp unknown). Patient is pregnant. Estimated Date of Delivery: 05/22/20  Prenatal care site: [facility]   Chief complaint: vaginal discharge   Location: vagina Onset/timing: today Duration: constant  Quality: thick, white, new odor  Severity: N/A Aggravating or alleviating conditions: None  Associated signs/symptoms: Reports change in vaginal odor  Context: States she noticed that her vaginal discharged changed and it had an odor today   S: Resting comfortably. no CTX, no VB.no LOF,  Active fetal movement.  Maternal Medical History:  Past Medical Hx:  has a past medical history of Medical history non-contributory.    Past Surgical Hx:  has no past surgical history on file.   Allergies  Allergen Reactions  . Vicodin [Hydrocodone-Acetaminophen]     Patient describes seizure like activity with administration.  Happened 7 years ago     Prior to Admission medications   Medication Sig Start Date End Date Taking? Authorizing Provider  Prenatal Vit-Fe Fumarate-FA (MULTIVITAMIN-PRENATAL) 27-0.8 MG TABS tablet Take 1 tablet by mouth daily at 12 noon.   Yes [provider]  clindamycin (CLEOCIN) 300 MG capsule Take 1 capsule (300 mg total) by mouth 3 (three) times daily. Patient not taking: Reported on 01/23/2020 07/03/15   Emily Filbert, MD  ibuprofen (ADVIL,MOTRIN) 600 MG tablet Take 1 tablet (600 mg total) by mouth every 6 (six) hours. Patient not taking: Reported on 01/23/2020 02/22/15   Nadara Mustard, MD  oxyCODONE-acetaminophen (PERCOCET) 5-325 MG tablet Take 2 tablets by mouth every 6 (six) hours as needed for moderate pain or severe pain. Patient not taking: Reported on 01/23/2020 07/03/15   Emily Filbert, MD    Social History: She  reports that she has been smoking cigarettes. She does not have any smokeless tobacco history on file. She reports previous  alcohol use. She reports previous drug use.  Family History: family history includes Cancer in her paternal grandmother.  no history of gyn cancers  Review of Systems: A full review of systems was performed and negative except as noted in the HPI.    O:  BP 109/65 (BP Location: Left Arm)   Pulse 88   Temp 98.3 F (36.8 C)   Resp 18   Ht 5\' 4"  (1.626 m)   Wt 61.7 kg   LMP  (LMP Unknown)   BMI 23.35 kg/m  Results for orders placed or performed during the hospital encounter of 01/23/20 (from the past 48 hour(s))  Wet prep, genital   Collection Time: 01/23/20  5:21 PM   Specimen: Cervical/Vaginal swab  Result Value Ref Range   Yeast Wet Prep HPF POC NONE SEEN NONE SEEN   Trich, Wet Prep NONE SEEN NONE SEEN   Clue Cells Wet Prep HPF POC PRESENT (A) NONE SEEN   WBC, Wet Prep HPF POC FEW (A) NONE SEEN   Sperm NONE SEEN   Urinalysis, Complete w Microscopic   Collection Time: 01/23/20  5:21 PM  Result Value Ref Range   Color, Urine YELLOW (A) YELLOW   APPearance CLOUDY (A) CLEAR   Specific Gravity, Urine 1.021 1.005 - 1.030   pH 6.0 5.0 - 8.0   Glucose, UA NEGATIVE NEGATIVE mg/dL   Hgb urine dipstick NEGATIVE NEGATIVE   Bilirubin Urine NEGATIVE NEGATIVE   Ketones, ur NEGATIVE NEGATIVE mg/dL   Protein, ur NEGATIVE NEGATIVE mg/dL   Nitrite NEGATIVE NEGATIVE   Leukocytes,Ua MODERATE (A) NEGATIVE  RBC / HPF 6-10 0 - 5 RBC/hpf   WBC, UA 21-50 0 - 5 WBC/hpf   Bacteria, UA RARE (A) NONE SEEN   Squamous Epithelial / LPF 11-20 0 - 5   Mucus PRESENT    Ca Oxalate Crys, UA PRESENT      Constitutional: NAD, AAOx3  HE/ENT: extraocular movements grossly intact, moist mucous membranes CV: RRR PULM: nl respiratory effort, CTABL     Abd: gravid, non-tender, non-distended, soft      Ext: Non-tender, Nonedmeatous   Psych: mood appropriate, speech normal Pelvic: moderate amount of white to gray vaginal discharge present with mild odor   Doppler: 150bpm distinguished from maternal  pulse Toco: occasional   Assessment: 24 y.o. [redacted]w[redacted]d here for antenatal surveillance during pregnancy.  Principle diagnosis: bacterial vaginosis   Plan:  Labor: not present.   Fetal heart tone: reassuring   Rx for Flagyl sent to pharmacy of choice - 500mg  PO BID for 7 days   D/c home stable, precautions reviewed, follow-up as scheduled.   ----- Drinda Butts, CNM Certified Nurse Midwife [facility]

## 2020-01-23 NOTE — OB Triage Note (Signed)
Pt. presented to L/D with reported constant lower abdominal pain that began last week (rated 8/10) and intermittent tightening that began two weeks ago, rated 8/10. She also reports constant hip and lower pain. She reports a decrease in fetal movement today. She reported dark red bleeding last week for one day, with none noted today. She has noted an "odd" colored discharge, that is described as "brownish-grey" with a foul smell. No urinary pain or symptoms; no recent intercourse.  A clean pad was placed upon arrival. FHT 146. VSS.  Patient stable at this time. Toco applied.

## 2020-01-24 ENCOUNTER — Ambulatory Visit
Admission: RE | Admit: 2020-01-24 | Discharge: 2020-01-24 | Disposition: A | Discharge status: Home / Self Care | Payer: Medicaid Other | Source: Ambulatory Visit | Attending: Physician Assistant | Admitting: Physician Assistant

## 2020-01-24 DIAGNOSIS — Z3480 Encounter for supervision of other normal pregnancy, unspecified trimester: Secondary | ICD-10-CM | POA: Diagnosis present

## 2020-01-24 LAB — URINE CULTURE

## 2020-02-13 ENCOUNTER — Other Ambulatory Visit: Payer: Self-pay

## 2020-02-13 ENCOUNTER — Observation Stay
Admission: EM | Admit: 2020-02-13 | Discharge: 2020-02-13 | Disposition: A | Discharge status: Home / Self Care | Payer: Medicaid Other

## 2020-02-13 ENCOUNTER — Encounter: Payer: Self-pay | Admitting: Obstetrics & Gynecology

## 2020-02-13 ENCOUNTER — Observation Stay: Payer: Medicaid Other

## 2020-02-13 DIAGNOSIS — Z87891 Personal history of nicotine dependence: Secondary | ICD-10-CM | POA: Insufficient documentation

## 2020-02-13 DIAGNOSIS — O98812 Other maternal infectious and parasitic diseases complicating pregnancy, second trimester: Secondary | ICD-10-CM | POA: Insufficient documentation

## 2020-02-13 DIAGNOSIS — Z3A25 25 weeks gestation of pregnancy: Secondary | ICD-10-CM | POA: Diagnosis not present

## 2020-02-13 DIAGNOSIS — A749 Chlamydial infection, unspecified: Secondary | ICD-10-CM | POA: Insufficient documentation

## 2020-02-13 DIAGNOSIS — O4692 Antepartum hemorrhage, unspecified, second trimester: Principal | ICD-10-CM | POA: Insufficient documentation

## 2020-02-13 LAB — URINALYSIS, COMPLETE (UACMP) WITH MICROSCOPIC
Bilirubin Urine: NEGATIVE
Glucose, UA: NEGATIVE mg/dL
Ketones, ur: NEGATIVE mg/dL
Nitrite: NEGATIVE
Protein, ur: 30 mg/dL — AB
Specific Gravity, Urine: 1.021 (ref 1.005–1.030)
pH: 8 (ref 5.0–8.0)

## 2020-02-13 LAB — TYPE AND SCREEN
ABO/RH(D): A POS
Antibody Screen: NEGATIVE

## 2020-02-13 LAB — CBC
HCT: 28.5 % — ABNORMAL LOW (ref 36.0–46.0)
Hemoglobin: 9.8 g/dL — ABNORMAL LOW (ref 12.0–15.0)
MCH: 31.7 pg (ref 26.0–34.0)
MCHC: 34.4 g/dL (ref 30.0–36.0)
MCV: 92.2 fL (ref 80.0–100.0)
Platelets: 193 10*3/uL (ref 150–400)
RBC: 3.09 MIL/uL — ABNORMAL LOW (ref 3.87–5.11)
RDW: 12.9 % (ref 11.5–15.5)
WBC: 7.5 10*3/uL (ref 4.0–10.5)
nRBC: 0 % (ref 0.0–0.2)

## 2020-02-13 LAB — CHLAMYDIA/NGC RT PCR (ARMC ONLY)
Chlamydia Tr: DETECTED — AB
N gonorrhoeae: NOT DETECTED

## 2020-02-13 LAB — WET PREP, GENITAL
Clue Cells Wet Prep HPF POC: NONE SEEN
Sperm: NONE SEEN
Trich, Wet Prep: NONE SEEN
Yeast Wet Prep HPF POC: NONE SEEN

## 2020-02-13 MED ORDER — BETAMETHASONE SOD PHOS & ACET 6 (3-3) MG/ML IJ SUSP
12.0000 mg | INTRAMUSCULAR | Status: DC
  Administered 2020-02-13: 12 mg via INTRAMUSCULAR
  Filled 2020-02-13: qty 5

## 2020-02-13 MED ORDER — LACTATED RINGERS IV BOLUS
1000.0000 mL | Freq: Once | INTRAVENOUS | Status: AC
  Administered 2020-02-13: 1000 mL via INTRAVENOUS

## 2020-02-13 MED ORDER — AZITHROMYCIN 500 MG PO TABS
1000.0000 mg | ORAL_TABLET | Freq: Once | ORAL | Status: AC
  Administered 2020-02-13: 1000 mg via ORAL
  Filled 2020-02-13: qty 2

## 2020-02-13 MED ORDER — BETAMETHASONE SOD PHOS & ACET 6 (3-3) MG/ML IJ SUSP: 12.0000 mg | INTRAMUSCULAR | Status: DC

## 2020-02-13 NOTE — H&P (Signed)
HISTORY AND PHYSICAL NOTE  History of Present Illness: Colleen Mccarthy is a 24 y.o. [id number] at [redacted]w[redacted]d admitted for vaginal bleeding in 2nd trimester.   Patient reports the fetal movement as active. Patient reports uterine contraction  activity as uterine tightening every couple of minutes. Patient reports  vaginal bleeding as starting initially like the amount of day 2 of a menstrual cycle. Has lightened to spotting and brown discharge. . Patient describes fluid per vagina as None. Fetal presentation is unsure.  Patient Active Problem List   Diagnosis Date Noted  . Vaginal bleeding in pregnancy, second trimester 02/13/2020    Past Medical History:  Diagnosis Date  . Drug abuse (HCC) 02/11/2015  . Medical history non-contributory     History reviewed. No pertinent surgical history.  OB History  Gravida Para Term Preterm AB Living  5 3 3   1 3   SAB TAB Ectopic Multiple Live Births  0 0 0 0      # Outcome Date GA Lbr Len/2nd Weight Sex Delivery Anes PTL Lv  5 Current           4 Term 02/20/15 [redacted]w[redacted]d  2964 g M Vag-Spont     3 AB           2 Term           1 Term             Social History   Socioeconomic History  . Marital status: Married    Spouse name: Not on file  . Number of children: Not on file  . Years of education: Not on file  . Highest education level: Not on file  Occupational History  . Not on file  Tobacco Use  . Smoking status: Former Smoker    Types: Cigarettes  Substance and Sexual Activity  . Alcohol use: Not Currently    Comment: occasionally  . Drug use: Not Currently  . Sexual activity: Not Currently    Birth control/protection: None    Comment: undecided  Other Topics Concern  . Not on file  Social History Narrative  . Not on file   Social Determinants of Health   Financial Resource Strain:   . Difficulty of Paying Living Expenses:   Food Insecurity:   . Worried About [redacted]w[redacted]d in the Last Year:   . Programme researcher, broadcasting/film/video in the Last  Year:   Transportation Needs:   . Barista (Medical):   Freight forwarder Lack of Transportation (Non-Medical):   Physical Activity:   . Days of Exercise per Week:   . Minutes of Exercise per Session:   Stress:   . Feeling of Stress :   Social Connections:   . Frequency of Communication with Friends and Family:   . Frequency of Social Gatherings with Friends and Family:   . Attends Religious Services:   . Active Member of Clubs or Organizations:   . Attends [employer] Meetings:   Banker Marital Status:     Family History  Problem Relation Age of Onset  . Cancer Paternal Grandmother     Allergies  Allergen Reactions  . Vicodin [Hydrocodone-Acetaminophen]     Patient describes seizure like activity with administration.  Happened 7 years ago    Medications Prior to Admission  Medication Sig Dispense Refill Last Dose  . Prenatal Vit-Fe Fumarate-FA (MULTIVITAMIN-PRENATAL) 27-0.8 MG TABS tablet Take 1 tablet by mouth daily at 12 noon.   02/13/2020 at Unknown time  Review of Systems - Negative except vaginal bleeding  Vitals:  BP (!) 106/56 (BP Location: Left Arm)   Pulse 70   Temp 98.3 F (36.8 C) (Oral)   Resp 16   Ht 5\' 5"  (1.651 m)   LMP  (LMP Unknown)   SpO2 98%   BMI 22.64 kg/m  Physical Examination: CONSTITUTIONAL: Well-developed, well-nourished female in no acute distress.  HENT:  Normocephalic, atraumatic EYES: Conjunctivae and EOM are normal. Pupils are equal, round, and reactive to light. No scleral icterus.  NECK: Normal range of motion, supple, no masses SKIN: Skin is warm and dry. No rash noted. Not diaphoretic. No erythema. No pallor. NEUROLGIC: Alert and oriented to person, place, and time. Normal reflexes, muscle tone coordination. No cranial nerve deficit noted. PSYCHIATRIC: Normal mood and affect. Normal behavior. Normal judgment and thought content. CARDIOVASCULAR: Normal heart rate noted, regular rhythm RESPIRATORY: Effort and breath sounds  normal, no problems with respiration noted ABDOMEN: Soft, nontender, nondistended, gravid. MUSCULOSKELETAL: Normal range of motion. No edema and no tenderness. 2+ distal pulses.  Cervix: Evaluated by sterile speculum exam. and found to be closed/ Long/Floating and fetal presentation is cephalic by . Moderate amount of thick, dark red blood noted in vaginal vault.  Membranes:intact Fetal Monitoring:Baseline: 145 bpm  - variable x 1, monitored for 30 min after variable and no further decelerations  Tocometer: Irregular contractions, 30sec long, mildFlat  Labs:  Results for orders placed or performed during the hospital encounter of 02/13/20 (from the past 24 hour(s))  Urinalysis, Complete w Microscopic   Collection Time: 02/13/20 10:44 AM  Result Value Ref Range   Color, Urine AMBER (A) YELLOW   APPearance TURBID (A) CLEAR   Specific Gravity, Urine 1.021 1.005 - 1.030   pH 8.0 5.0 - 8.0   Glucose, UA NEGATIVE NEGATIVE mg/dL   Hgb urine dipstick MODERATE (A) NEGATIVE   Bilirubin Urine NEGATIVE NEGATIVE   Ketones, ur NEGATIVE NEGATIVE mg/dL   Protein, ur 30 (A) NEGATIVE mg/dL   Nitrite NEGATIVE NEGATIVE   Leukocytes,Ua TRACE (A) NEGATIVE   RBC / HPF 0-5 0 - 5 RBC/hpf   WBC, UA 11-20 0 - 5 WBC/hpf   Bacteria, UA RARE (A) NONE SEEN   Squamous Epithelial / LPF 6-10 0 - 5   Mucus PRESENT    Amorphous Crystal PRESENT   Wet prep, genital   Collection Time: 02/13/20 12:42 PM  Result Value Ref Range   Yeast Wet Prep HPF POC NONE SEEN NONE SEEN   Trich, Wet Prep NONE SEEN NONE SEEN   Clue Cells Wet Prep HPF POC NONE SEEN NONE SEEN   WBC, Wet Prep HPF POC FEW (A) NONE SEEN   Sperm NONE SEEN   Chlamydia/NGC rt PCR ([facility] only)   Collection Time: 02/13/20 12:42 PM   Specimen: Genital  Result Value Ref Range   Specimen source GC/Chlam ENDOCERVICAL    Chlamydia Tr DETECTED (A) NOT DETECTED   N gonorrhoeae NOT DETECTED NOT DETECTED  CBC   Collection Time: 02/13/20  1:19 PM  Result Value  Ref Range   WBC 7.5 4.0 - 10.5 K/uL   RBC 3.09 (L) 3.87 - 5.11 MIL/uL   Hemoglobin 9.8 (L) 12.0 - 15.0 g/dL   HCT 02/15/20 (L) 03.1 - 59.4 %   MCV 92.2 80.0 - 100.0 fL   MCH 31.7 26.0 - 34.0 pg   MCHC 34.4 30.0 - 36.0 g/dL   RDW 58.5 92.9 - 24.4 %   Platelets 193 150 -  400 K/uL   nRBC 0.0 0.0 - 0.2 %  Type and screen [place]   Collection Time: 02/13/20  1:19 PM  Result Value Ref Range   ABO/RH(D) A POS    Antibody Screen NEG    Sample Expiration      02/16/2020,2359 Performed at [facility], [address], [place], [place] [address]     Imaging Studies: [place] Limited  Result Date: 02/13/2020 CLINICAL DATA:  Second trimester vaginal bleeding for 1 day. EXAM: LIMITED OBSTETRIC ULTRASOUND FINDINGS: Number of Fetuses: 1 Heart Rate:  122 bpm Movement: Yes Presentation: Cephalic Placental Location: Anterior. No retroplacental fluid collection or hematoma to suggest abruption. Normal vascularity. Previa: None Amniotic Fluid (Subjective):  Within normal limits. AFI: 16.7 cm BPD: 6.63 cm 26 w  5 d MATERNAL FINDINGS: Cervix:  Appears closed. Uterus/Adnexae: No abnormality visualized. IMPRESSION: 1. Single living intrauterine fetus estimated at 26 weeks and 5 days gestation. 2. Normal sonographic appearance of the anterior placenta. No sonographic findings for placental abruption. 3. Normal amniotic fluid volume. This exam is performed on an emergent basis and does not comprehensively evaluate fetal size, dating, or anatomy; follow-up complete OB US should be considered if further fetal assessment is warranted. Electronically Signed   By: Marijo Sanes M.D.   On: 02/13/2020 11:32   US OB Follow Up  Result Date: 01/24/2020 CLINICAL DATA:  Pregnancy.  Follow-up anatomy EXAM: OBSTETRIC 14+ WK ULTRASOUND FOLLOW-UP FINDINGS: Number of Fetuses: 1 Heart Rate:  155 bpm Movement: Yes Presentation: Cephalic-variable Previa: No Placental Location: Anterior Amniotic Fluid  (Subjective): Normal Amniotic Fluid (Objective): Vertical pocket 6.3cm FETAL BIOMETRY BPD:  5.9cm 24w 0d HC:    21.5cm 23w 4d AC:    17.9cm 22w 5d FL:    4.2cm 23w 4d Current Mean GA: 23w 3d [place] EDC: 05/19/2020 Assigned GA: 23w 0d Assigned EDC: 05/22/2020 Estimated Fetal Weight:  571g 52%ile FETAL ANATOMY Lateral Ventricles: Previously seen Thalami/CSP: Previously seen Posterior Fossa: Appears normal Nuchal Region: Appears normal Upper Lip: Appears normal Spine: Appears normal 4 Chamber Heart on Left: Appears normal LVOT: Appears normal RVOT: Appears normal Stomach on Left: Previously seen 3 Vessel Cord: Previously seen Cord Insertion site: Previously seen Kidneys: Appears normal Bladder: Previously seen Extremities: Previously seen Sex: Female Technical Limitations: None Maternal Findings: Cervix:  3.3 cm IMPRESSION: 1. Single live intrauterine gestation with assigned gestational age of [redacted] weeks 0 days. Adequate interval growth. 2. Complete fetal anatomic survey, as detailed above. No fetal anomaly identified. 3. Estimated fetal weight is in the 52nd percentile. Electronically Signed   By: Davina Poke D.O.   On: 01/24/2020 17:03    Assessment and Plan: Patient Active Problem List   Diagnosis Date Noted  . Vaginal bleeding in pregnancy, second trimester 02/13/2020   Admit to Antenatal Dr. Leafy Ro consulted on admission.  Betamethasone x 2 doses Will recheck presentation if she does progress in preterm labor to determine route of delivery Routine antenatal care Continuous Toco - FHR for 45min every 4 hours while uterine contraction or irritability is present.  Monitor vaginal bleeding - notify provider for increased vaginal bleeding or clots  Labs - CBC, UA, Urine culture, wet prep, GC/CT, Type and screen Rh pos - Rhogam not indicated   Drinda Butts, CNM Certified Nurse Midwife [facility]

## 2020-02-13 NOTE — Progress Notes (Signed)
Received notification about positive chlamydia screen.  Discussed positive screen with [place].  She reports last sexual intercourse was 3 weeks ago.  Her partner is on the road a lot and is currently in [place].  Reviewed that he would need to be treated too.  Recommended that he go to an urgent care for an Rx for azithromycin.  Also recommended that she be rescreened for a TOC in 3-4 weeks.  Will give 1 dose of Azithromycin 1gram PO now.  Questions answered to patient satisfaction.    Margaretmary Eddy, CNM Certified Nurse Midwife [facility]

## 2020-02-13 NOTE — Discharge Summary (Signed)
Patient ID: Colleen Mccarthy MRN: [id number] DOB/AGE: 1996-04-29 24 y.o.  Admit date: 02/13/2020 Discharge date: 02/13/2020  Admission Diagnoses: Vaginal bleeding in 2nd trimester   Discharge Diagnoses: Same, chlamydia infection in pregnancy   Prenatal Procedures: ultrasound and betamethasone injection  Consults: None  Significant Diagnostic Studies:  Results for orders placed or performed during the hospital encounter of 02/13/20 (from the past 168 hour(s))  Urinalysis, Complete w Microscopic   Collection Time: 02/13/20 10:44 AM  Result Value Ref Range   Color, Urine AMBER (A) YELLOW   APPearance TURBID (A) CLEAR   Specific Gravity, Urine 1.021 1.005 - 1.030   pH 8.0 5.0 - 8.0   Glucose, UA NEGATIVE NEGATIVE mg/dL   Hgb urine dipstick MODERATE (A) NEGATIVE   Bilirubin Urine NEGATIVE NEGATIVE   Ketones, ur NEGATIVE NEGATIVE mg/dL   Protein, ur 30 (A) NEGATIVE mg/dL   Nitrite NEGATIVE NEGATIVE   Leukocytes,Ua TRACE (A) NEGATIVE   RBC / HPF 0-5 0 - 5 RBC/hpf   WBC, UA 11-20 0 - 5 WBC/hpf   Bacteria, UA RARE (A) NONE SEEN   Squamous Epithelial / LPF 6-10 0 - 5   Mucus PRESENT    Amorphous Crystal PRESENT   Wet prep, genital   Collection Time: 02/13/20 12:42 PM  Result Value Ref Range   Yeast Wet Prep HPF POC NONE SEEN NONE SEEN   Trich, Wet Prep NONE SEEN NONE SEEN   Clue Cells Wet Prep HPF POC NONE SEEN NONE SEEN   WBC, Wet Prep HPF POC FEW (A) NONE SEEN   Sperm NONE SEEN   Chlamydia/NGC rt PCR ([facility] only)   Collection Time: 02/13/20 12:42 PM   Specimen: Genital  Result Value Ref Range   Specimen source GC/Chlam ENDOCERVICAL    Chlamydia Tr DETECTED (A) NOT DETECTED   N gonorrhoeae NOT DETECTED NOT DETECTED  CBC   Collection Time: 02/13/20  1:19 PM  Result Value Ref Range   WBC 7.5 4.0 - 10.5 K/uL   RBC 3.09 (L) 3.87 - 5.11 MIL/uL   Hemoglobin 9.8 (L) 12.0 - 15.0 g/dL   HCT 28.5 (L) 36.0 - 46.0 %   MCV 92.2 80.0 - 100.0 fL   MCH 31.7 26.0 - 34.0 pg   MCHC 34.4 30.0  - 36.0 g/dL   RDW 12.9 11.5 - 15.5 %   Platelets 193 150 - 400 K/uL   nRBC 0.0 0.0 - 0.2 %  Type and screen Colleen Mccarthy   Collection Time: 02/13/20  1:19 PM  Result Value Ref Range   ABO/RH(D) A POS    Antibody Screen NEG    Sample Expiration      02/16/2020,2359 Performed at [facility], [address] [address] [place], Colleen Mcc[place] [id number]     Treatments: antibiotics: azithromycin and steroids: betamethasone  Hospital Course:  This is a 24 y.o. [id number] with IUP at [redacted]w[redacted]d admitted for vaginal bleeding in 2nd trimester, noted to have a cervical exam of Cl/Th/High.  No leaking of fluid but had several reports of vaginal bleeding.  She received an LR fluid bolus for nonpainful, mild uterine contraction, labs for UA, urine culture, CBC, GC/CT, and type and screen were collected. She also received betamethasone x 1 dose.  Her chlamydia screen was positive and she was treated with 1 dose of Azithromycin 1gram PO.  FHR was appropriate for gestational age and irregular, mild contractions were noted on the [facility], though she denied cramping or contractions.   Colleen Mccarthy was observed in  the birthplace for signs of preterm labor and monitoring of vaginal bleeding.  She requested to ambulate around the unit and subsequently eloped for 2 hours.  She returned to L&D after multiple attempts to contact her, IV saline lock still in place.  Colleen Mccarthy reports needing to give her mother her food stamps card.  She would prefer to discharge home and return for her 2nd betamethasone injection tomorrow.  She denies any continued vaginal bleeding, cramping, or contractions.   Preterm labor and vaginal bleeding precautions reviewed.  This episode is most likely due to chlamydia infection but instructed to return to L&D for cramping, contractions, vaginal bleeding, LOF, or decreased fetal movement.  She will return to L&D tomorrow between 1-2pm for her 2nd betamethasone injection, follow up with [facility] within 1 week.   She had no signs/symptoms of progressing preterm labor or other maternal-fetal concerns.  She was deemed stable for discharge to home with outpatient follow up.  Discharge Physical Exam:  BP (!) 106/56 (BP Location: Left Arm)   Pulse 70   Temp 98.3 F (36.8 C) (Oral)   Resp 16   Ht 5\' 5"  (1.651 m)   LMP  (LMP Unknown)   SpO2 98%   BMI 22.64 kg/m   General: NAD CV: RRR Pulm: CTABL, nl effort ABD: s/nd/nt, gravid DVT Evaluation: LE non-ttp, no evidence of DVT on exam.  SVE: deferred  FHT: 140 bpm, moderate variability, appropriate for gestational age TOCO: irregular uterine irritability   Discharge Condition: Stable  Disposition: Discharge disposition: 01-Home or Self Care       Allergies as of 02/13/2020      Reactions   Vicodin [hydrocodone-acetaminophen]    Patient describes seizure like activity with administration.  Happened 7 years ago      Medication List    TAKE these medications   betamethasone acetate-betamethasone sodium phosphate 6 (3-3) MG/ML injection Commonly known as: CELESTONE Inject 2 mLs (12 mg total) into the muscle every 24 hours x 2 doses. Start taking on: Feb 14, 2020   multivitamin-prenatal 27-0.8 MG Tabs tablet Take 1 tablet by mouth daily at 12 noon.      Follow-up Information    [facility] LABOR AND DELIVERY Follow up.   Specialty: Obstetrics and Gynecology Why: 02/14/20 at 1:30 pm for betamethasone injection  Contact information: [address] [place] [phone]       Center, [facility] Follow up in 1 week(s).   Why: prenatal care  Contact information: [address] [place] [phone]           Signed:  [phone] 02/13/2020 11:19 PM ----- 02/15/2020, CNM Certified Nurse Midwife [facility]

## 2020-02-13 NOTE — Discharge Instructions (Signed)
Preterm Labor and Birth Information Pregnancy normally lasts 39-41 weeks. Preterm labor is when labor starts early. It starts before you have been pregnant for 37 whole weeks. What are the risk factors for preterm labor? Preterm labor is more likely to occur in women who:  Have an infection while pregnant.  Have a cervix that is short.  Have gone into preterm labor before.  Have had surgery on their cervix.  Are younger than age 25.  Are older than age 18.  Are African American.  Are pregnant with two or more babies.  Take street drugs while pregnant.  Smoke while pregnant.  Do not gain enough weight while pregnant.  Got pregnant right after another pregnancy. What are the symptoms of preterm labor? Symptoms of preterm labor include:  Cramps. The cramps may feel like the cramps some women get during their period. The cramps may happen with watery poop (diarrhea).  Pain in the belly (abdomen).  Pain in the lower back.  Regular contractions or tightening. It may feel like your belly is getting tighter.  Pressure in the lower belly that seems to get stronger.  More fluid (discharge) leaking from the vagina. The fluid may be watery or bloody.  Water breaking. Why is it important to notice signs of preterm labor? Babies who are born early may not be fully developed. They have a higher chance for:  Long-term heart problems.  Long-term lung problems.  Trouble controlling body systems, like breathing.  Bleeding in the brain.  A condition called cerebral palsy.  Learning difficulties.  Death. These risks are highest for babies who are born before 34 weeks of pregnancy. How is preterm labor treated? Treatment depends on:  How long you were pregnant.  Your condition.  The health of your baby. Treatment may involve:  Having a stitch (suture) placed in your cervix. When you give birth, your cervix opens so the baby can come out. The stitch keeps the cervix  from opening too soon.  Staying at the hospital.  Taking or getting medicines, such as: ? Hormone medicines. ? Medicines to stop contractions. ? Medicines to help the baby's lungs develop. ? Medicines to prevent your baby from having cerebral palsy. What should I do if I am in preterm labor? If you think you are going into labor too soon, call your doctor right away. How can I prevent preterm labor?  Do not use any tobacco products. ? Examples of these are cigarettes, chewing tobacco, and e-cigarettes. ? If you need help quitting, ask your doctor.  Do not use street drugs.  Do not use any medicines unless you ask your doctor if they are safe for you.  Talk with your doctor before taking any herbal supplements.  Make sure you gain enough weight.  Watch for infection. If you think you might have an infection, get it checked right away.  If you have gone into preterm labor before, tell your doctor. This information is not intended to replace advice given to you by your health care provider. Make sure you discuss any questions you have with your health care provider. Document Revised: 01/04/2019 Document Reviewed: 02/03/2016 Elsevier Patient Education  2020 Elsevier Inc.  Vaginal Bleeding During Pregnancy, Second Trimester  A small amount of bleeding (spotting) from the vagina is common during pregnancy. Sometimes the bleeding is normal and is not a sign of problems. In some other cases, it is a sign of something serious. Tell your doctor right away if there is any  bleeding from your vagina. Follow these instructions at home: Activity  Follow your doctor's instructions about how active you can be.  If needed, make plans for someone to help with your normal activities.  Do not exercise or do activities that take a lot of effort until your doctor says that this is safe.  Do not lift anything that is heavier than 10 lb (4.5 kg) until your doctor says that this is safe.  Do not  have sex or orgasms until your doctor says that this is safe. Medicines  Take over-the-counter and prescription medicines only as told by your doctor.  Do not take aspirin. It can cause bleeding. General instructions  Watch your condition for any changes.  Write down: ? The number of pads you use each day. ? How often you change pads. ? How soaked your pads are.  Do not use tampons.  Do not douche.  If you pass any tissue from your vagina, save it to show to your doctor.  Keep all follow-up visits as told by your doctor. This is important. Contact a doctor if:  You have bleeding in the vagina at any time during pregnancy.  You have cramps.  You have a fever that does not get better with medicine. Get help right away if:  You have very bad cramps in your back or belly (abdomen).  You have contractions.  You have chills.  You pass large clots or a lot of tissue from your vagina.  Your bleeding gets worse.  You feel light-headed.  You feel weak.  You pass out (faint).  You are leaking fluid from your vagina.  You have a gush of fluid from your vagina. Summary  Sometimes vaginal bleeding during pregnancy is normal and is not a problem. Sometimes it may be a sign of something serious.  Tell your doctor about any bleeding from your vagina right away.  Follow your doctor's instructions about how active you can be. You may need someone to help you with your normal activities. This information is not intended to replace advice given to you by your health care provider. Make sure you discuss any questions you have with your health care provider. Document Revised: 01/01/2019 Document Reviewed: 12/14/2016 Elsevier Patient Education  [place].   Chlamydia, Female  Chlamydia is a STD (sexually transmitted disease). This is an infection that spreads through sexual contact. If it is not treated, it can cause serious problems. It must be treated with  antibiotic medicine. If this infection is not treated and you are pregnant or become pregnant, your baby could get it during delivery. This may cause bad health problems for the baby. Sometimes, you may not have symptoms (asymptomatic). When you have symptoms, they can include:  Burning when you pee (urinate).  Peeing often.  Fluid (discharge) coming from the vagina.  Redness, soreness, and swelling (inflammation) of the butt (rectum).  Bleeding or fluid coming from the butt.  Belly (abdominal) pain.  Pain during sex.  Bleeding between periods.  Itching, burning, or redness in the eyes.  Fluid coming from the eyes. Follow these instructions at home: Medicines  Take over-the-counter and prescription medicines only as told by your doctor.  Take your antibiotic medicine as told by your doctor. Do not stop taking the antibiotic even if you start to feel better. Sexual activity  Tell sex partners about your infection. Sex partners are people you had oral, anal, or vaginal sex with within 60 days of when you  started getting sick. They need treatment, too.  Do not have sex until: ? You and your sex partners have been treated. ? Your doctor says it is okay.  If you have a single dose treatment, wait 7 days before having sex. General instructions  It is up to you to get your test results. Ask your doctor when your results will be ready.  Get a lot of rest.  Eat healthy foods.  Drink enough fluid to keep your pee (urine) clear or pale yellow.  Keep all follow-up visits as told by your doctor. You may need tests after 3 months. Preventing chlamydia  The only way to prevent chlamydia is not to have sex. To lower your risk: ? Use latex condoms correctly. Do this every time you have sex. ? Avoid having many sex partners. ? Ask if your partner has been tested for STDs and if he or she had negative results. Contact a doctor if:  You get new symptoms.  You do not get better  with treatment.  You have a fever or chills.  You have pain during sex. Get help right away if:  Your pain gets worse and does not get better with medicine.  You get flu-like symptoms, such as: ? Night sweats. ? Sore throat. ? Muscle aches.  You feel sick to your stomach (nauseous).  You throw up (vomit).  You have trouble swallowing.  You have bleeding: ? Between periods. ? After sex.  You have irregular periods.  You have belly pain that does not get better with medicine.  You have lower back pain that does not get better with medicine.  You feel weak or dizzy.  You pass out (faint).  You are pregnant and you get symptoms of chlamydia. Summary  Chlamydia is an infection that spreads through sexual contact.  Sometimes, chlamydia can cause no symptoms (asymptomatic).  Do not have sex until your doctor says it is okay.  All sex partners will have to be treated for chlamydia. This information is not intended to replace advice given to you by your health care provider. Make sure you discuss any questions you have with your health care provider. Document Revised: 03/06/2018 Document Reviewed: 09/01/2016 Elsevier Patient Education  2020 [employer].

## 2020-02-13 NOTE — Progress Notes (Signed)
Pt received first dose of betamethasone at 1346. 2nd dose scheduled for tomorrow.

## 2020-02-13 NOTE — Progress Notes (Signed)
Pt was requesting to be allowed to walk around to get out her room. Provider was ok with her walking around the unit after getting an NST. Pt was told where she could walk and to not go through any double doors and to stay on the unit. Pt verbalized understanding. RN was walking back from mother baby at roughly 2100 and saw patient heading toward mother baby and was told not to go through the double doors in which she was heading. She said OK and turned around. RN then noticed at 2120 that the patient was not in her room and not on the unit. 3rd floor security desk was called and said to have seen a her getting on the elevator and going to a lower level. [facility] of [facility] was notified and went to lower levels, cafe area, parking lot and medical mall in search of the patient. A/C was notified. Security stated she was seen going out the medical mall doors with no return. Provider was notified that patient has left the hospital with a saline lock in place. Both contact numbers available in the chart were tried with no answer. [facility] received two phone calls from the patient via caller ID. When the call was answered, both times, was hung up after a couple seconds. SCC tried to contact pt through [facility] phone and was able to get an answer and when asked "Johna where are you" was hung up on. Provider notified of situation.

## 2020-02-13 NOTE — Progress Notes (Signed)
Pt off the unit and transported to ultrasound. Margaretmary Eddy CNM aware.

## 2020-02-13 NOTE — Progress Notes (Signed)
Pt returned to unit and reported her phone head phones were not working and that she was trying to reach [place]. Pt stated she need to give her mother the food stamps card. Pt verbalized wish to go home, but agreed to NST. Provider notified in small variables and 10x10 accels and was ok with d/c. Pt denied any current or new bleeding. Pt informed of reasons to seeks medical attention regarding the safety of her and the baby. Discussed need to come back tmrw for a scheduled BTMZ injection and pt agreed. Pt verbalized understanding of discharge instructions.

## 2020-02-13 NOTE — Significant Event (Signed)
Notified by primary RN that patient eloped from [facility]  Multiple attempts made to locate patient and call her contact numbers.  3rd floor Security reports seeing patient enter [employer] on 3rd floor.  Hospital security confirmed exiting through the [facility] and did not reenter.  Saline lock was in place at the time of her elopement.  SCC and A/C notified of elopement.  Nursing staff instructed to notify me or on call midwife if patient returns to unit.   Margaretmary Eddy, CNM Certified Nurse Midwife [facility]

## 2020-02-13 NOTE — Progress Notes (Signed)
Pt returned from ultrasound and back in Rm Obs 3.

## 2020-02-13 NOTE — OB Triage Note (Addendum)
Pt arrived to L&D with complaints of vaginal bleeding. Pt is a [id number], [redacted]w[redacted]d.  Pt bleeding started this morning when she went to the bathroom. Pt said she went to the bathroom and saw pink discharge. Then when she went to the table and felt a gush of blood. Pt states that it reminded her of a "second period day." Pt states positive fetal movement. Denies N/V, LOF, and contractions. No bleeding noted on assessment. VSS and monitors placed on the patient. Initial FHT 145. Will continue to monitor.

## 2020-02-14 LAB — URINE CULTURE: Culture: NO GROWTH

## 2020-03-02 ENCOUNTER — Telehealth: Payer: Self-pay | Admitting: Family Medicine

## 2020-03-02 NOTE — Telephone Encounter (Signed)
Call to patient to discuss treatment for + GC and CH from 4/29 and 5/20.  Documentation from [facility] only shows that patient was treated for BV and Chlamydia.  Patient reports having appointment at [facility] for treatment on 03/03/20.  Patient informed of the need for correct treatment to rid bacterial infection.  Patient verbalized understanding.   Called to [facility] to fax over CD report with treatment once complete.

## 2020-03-19 ENCOUNTER — Other Ambulatory Visit: Payer: Self-pay | Admitting: Physician Assistant

## 2020-04-08 ENCOUNTER — Other Ambulatory Visit: Payer: Self-pay | Admitting: Physician Assistant

## 2020-04-08 DIAGNOSIS — Z720 Tobacco use: Secondary | ICD-10-CM

## 2020-04-08 DIAGNOSIS — Z3483 Encounter for supervision of other normal pregnancy, third trimester: Secondary | ICD-10-CM

## 2020-04-08 DIAGNOSIS — Z8619 Personal history of other infectious and parasitic diseases: Secondary | ICD-10-CM

## 2020-04-08 DIAGNOSIS — O99019 Anemia complicating pregnancy, unspecified trimester: Secondary | ICD-10-CM

## 2020-04-24 ENCOUNTER — Other Ambulatory Visit: Payer: Self-pay

## 2020-04-24 ENCOUNTER — Ambulatory Visit
Admission: RE | Admit: 2020-04-24 | Discharge: 2020-04-24 | Disposition: A | Discharge status: Home / Self Care | Payer: Medicaid Other | Source: Ambulatory Visit | Attending: Physician Assistant | Admitting: Physician Assistant

## 2020-04-24 DIAGNOSIS — Z720 Tobacco use: Secondary | ICD-10-CM

## 2020-04-24 DIAGNOSIS — O99019 Anemia complicating pregnancy, unspecified trimester: Secondary | ICD-10-CM | POA: Diagnosis present

## 2020-04-24 DIAGNOSIS — Z8619 Personal history of other infectious and parasitic diseases: Secondary | ICD-10-CM

## 2020-04-24 DIAGNOSIS — Z3483 Encounter for supervision of other normal pregnancy, third trimester: Secondary | ICD-10-CM

## 2020-05-06 ENCOUNTER — Other Ambulatory Visit: Payer: Self-pay | Admitting: Certified Nurse Midwife

## 2020-05-06 NOTE — Progress Notes (Signed)
°  Colleen Mccarthy is a 24 y.o. [phone] female dated by [redacted]w[redacted]d ultrasound.    Pregnancy Issues: 1. S<D with poor weight gain  04/25/2020: EFW 2348g 20%, AFI 13.0cm 2. Tobacco use in pregnancy  Has now stopped 3. Hx chlamydia during pregnancy 02/13/20  Repeat testing 6/17 and 7/14 negative 4. Hx of Gonorrhea during pregnancy 01/23/20  Repeat testing 5/20, 6/27, and 7/14 negative 5. Possible preterm labor 01/2020  betamethasone x1 given 02/13/20 6. GBS positive  Will need antibiotic during labor 7. Varicella Non Immune  Advise postpartum vaccination 8. Pelvic pain  Severe, PT referral placed 05/06/2020  Elective IOL scheduled for 05/18/2020 at 0001 9. Transfer of care at [redacted]w[redacted]d from [facility]   Prenatal care site: [facility]  Prenatal Labs: Blood type/Rh A+  Antibody screen neg  Rubella Immune  Varicella Non-immune  RPR NR  HBsAg Neg  HIV NR  GC neg  Chlamydia neg  Genetic screening Not done  1 hour GTT 97  3 hour GTT n/a  GBS positive    Post Partum Planning: - Infant feeding: formula - Contraception: Depo  Tdap: 03/11/2020 Flu: not given

## 2020-05-12 ENCOUNTER — Other Ambulatory Visit: Payer: Self-pay

## 2020-05-12 ENCOUNTER — Ambulatory Visit: Payer: Medicaid Other | Attending: Certified Nurse Midwife

## 2020-05-12 DIAGNOSIS — M533 Sacrococcygeal disorders, not elsewhere classified: Secondary | ICD-10-CM | POA: Insufficient documentation

## 2020-05-12 DIAGNOSIS — R293 Abnormal posture: Secondary | ICD-10-CM | POA: Diagnosis present

## 2020-05-12 DIAGNOSIS — M62838 Other muscle spasm: Secondary | ICD-10-CM | POA: Insufficient documentation

## 2020-05-12 NOTE — Therapy (Signed)
[place] MAIN [facility] SERVICES [address] [place], [place], [address] Phone: [phone]   Fax:  [phone]  Physical Therapy Evaluation  The patient has been informed of current processes in place at Outpatient Rehab to protect patients from Covid-19 exposure including social distancing, schedule modifications, and new cleaning procedures. After discussing their particular risk with a therapist based on the patient's personal risk factors, the patient has decided to proceed with in-person therapy.  Patient Details  Name: Colleen Mccarthy MRN: [id number] Date of Birth: 10/29/1995 No data recorded  Encounter Date: 05/12/2020   PT End of Session - 05/12/20 1549    Visit Number 1    Number of Visits 4    Date for PT Re-Evaluation 06/09/20    Authorization Type [facility]    Authorization Time Period 05/12/20 through 06/09/20    Authorization - Visit Number 1    Authorization - Number of Visits 4    PT Start Time 4665    PT Stop Time 1630    PT Time Calculation (min) 41 min    Activity Tolerance Patient tolerated treatment well;No increased pain    Behavior During Therapy WFL for tasks assessed/performed           Past Medical History:  Diagnosis Date  . Drug abuse ([place]) 02/11/2015  . Medical history non-contributory     History reviewed. No pertinent surgical history.  There were no vitals filed for this visit.    Pelvic Floor Physical Therapy Evaluation and Assessment  SCREENING  Falls in last 6 mo: none   Red Flags:  Have you had any night sweats? no Unexplained weight loss? no Saddle anesthesia? no Unexplained changes in bowel or bladder habits? no  SUBJECTIVE  Patient reports: Has had pain for ~ 3 months. Which started feeling "like my hip needs to pop" and then it just got worse to the point where her legs want to give out on her. She needs help even getting her pants on or getting into the shower sometimes. Picking her legs up  is the worst, like trying to get into the bed. It takes her a long time to cook even a simple meal like spaghetti because she keeps having to sit down to get release. No relief in any position  Precautions:  [redacted] weeks pregnant.  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  Gynecological History: Chlamidia  Urinary History: Urinary frequency ~ every hour  Gastrointestinal History: none  Sexual activity/pain: none  Location of pain: B hips, lower abdomen and down to the knee B Current pain:  3/10  Max pain:  10+/10 Least pain:  0/10 Nature of pain: Dull ache with shooting/stabbing pains  Patient Goals: Just be able to do her daily activities without needing assistance from the kids.   OBJECTIVE  Posture/Observations:  Sitting: forward slumped Standing: L PSIS high  Palpation/Segmental Motion/Joint Play: TTP to R>L QL, L>R Iliacus and Psoas, B Rectus femoris insertion.  Special tests:   Supine-to-long-sit: LLE long in supine, less-long in sitting. ( R up-slip and L anterior rotation)  Range of Motion/Flexibilty:  Spine: Hips:   Strength/MMT: Deferred indefinately  LE MMT  LE MMT Left Right  Hip flex:  (L2) /5 /5  Hip ext: /5 /5  Hip abd: /5 /5  Hip add: /5 /5  Hip IR /5 /5  Hip ER /5 /5     Abdominal:  Palpation:  TTP to B rectus abdominus near insertion Diastasis: doming visualized with supine to long sit transfer.  Pelvic Floor External Exam: Deferred indefinately Introitus Appears:  Skin integrity:  Palpation: Cough: Prolapse visible?: Scar mobility:  Internal Vaginal Exam: Strength (PERF):  Symmetry: Palpation: Prolapse:   Internal Rectal Exam: Strength (PERF): Symmetry: Palpation: Prolapse:   Gait Analysis: Antalgic, posterior lean, hips anterior to shoulders.   Pelvic Floor Outcome Measures: FOTO: PFDI pain: 63  INTERVENTIONS THIS  SESSION: Manual: Performed TP release to L iliacus followed by an up-slip correction and MET correction x2 for L anterior rotation. Therex: Educated on how to perform a side-stretch, self MET correction for L anterior rotation, and self TP release to muscles surrounding the hips to decrease pain and improve ability to have a vaginal delivery with decreased risks for complications with delivery.  Total time: 60 min.                  Objective measurements completed on examination: See above findings.                 PT Short Term Goals - 05/13/20 1705      PT SHORT TERM GOAL #1   Title Patient will demonstrate improved pelvic alignment and balance of musculature surrounding the pelvis to facilitate decreased PFM spasms and decrease pelvic pain.    Baseline R Up-slip and L anterior rotation    Time 1    Period Weeks    Status New    Target Date 05/20/20      PT SHORT TERM GOAL #2   Title Patient will demonstrate HEP x1 in the clinic to demonstrate understanding and proper form to allow for further improvement.    Baseline Pt. lacks knowledge of what therapeutic exercises will allow for decreased pain/Sx.    Time 1    Period Weeks    Status New    Target Date 05/20/20             PT Long Term Goals - 05/13/20 1708      PT LONG TERM GOAL #1   Title Patient will describe pain no greater than 2/10 during ADL's including cooking dinner to demonstrate improved functional ability.    Baseline Pain as high as 10/10 when cooking a simple meal such as spaghetti    Time 2    Period Weeks    Status New    Target Date 05/27/20      PT LONG TERM GOAL #2   Title Patient will demonstrate a coordinated contraction, relaxation, and bulge of the pelvic floor muscles to demonstrate functional recruitment and motion and allow for successful PFM coordination to decrease likelihood of complication or tearing with delivery.    Baseline Pt. has had pelvic pain for ~ 3  months, indicating likely PFM spasms. Has not had intercourse to know if it would be painful. Due date is less than 2 weeks away.    Time 2    Period Weeks    Status New    Target Date 05/27/20                  Plan - 05/13/20 1641    Clinical Impression Statement Pt. is a 24 y/o female who presents today with cheif c/o pelvic pain during third trimester of pregnancy, Her PMH is significant for chlamidia during pregnancy and 3 prior vaginal deliveries. Her clinical assessment revealed a L anterior innominate rotation, R up-slip, and spasms surrounding B hips  and through rectus abdominus as well as a diastasis recti. She will benefit from skilled pelvic health PT to address the noted deficits and continue to assess for and address any other potential causes of Sx.    Personal Factors and Comorbidities Time since onset of injury/illness/exacerbation;Comorbidity 1    Comorbidities [redacted] weeks pregnant    Examination-Activity Limitations Lift;Bend;Squat;Bed Mobility;Dressing;Bathing;Stand;Locomotion Level;Caring for Others    Examination-Participation Restrictions Cleaning;Laundry;Shop;Occupation;Meal Prep    Stability/Clinical Decision Making Evolving/Moderate complexity    Clinical Decision Making Moderate    Rehab Potential Fair   proximity to due date   PT Frequency 2x / week    PT Duration 2 weeks    PT Treatment/Interventions ADLs/Self Care Home Management;Biofeedback;Electrical Stimulation;Traction;Therapeutic activities;Functional mobility training;Stair training;Gait training;Therapeutic exercise;Neuromuscular re-education;Patient/family education;Manual techniques;Taping;Spinal Manipulations;Joint Manipulations    PT Next Visit Plan re-assess pelvic alignment and teach pelvic tilts, perform TP release to rectus PRN and assess pubic symphysis alignment    Consulted and Agree with Plan of Care Patient           Patient will benefit from skilled therapeutic intervention in order to  improve the following deficits and impairments:  Abnormal gait, Decreased endurance, Difficulty walking, Hypomobility, Increased muscle spasms, Pain, Postural dysfunction, Increased fascial restricitons, Decreased activity tolerance  Visit Diagnosis: Abnormal posture  Other muscle spasm  Sacrococcygeal disorders, not elsewhere classified     Problem List Patient Active Problem List   Diagnosis Date Noted  . Vaginal bleeding in pregnancy, second trimester 02/13/2020  . Chlamydia infection affecting pregnancy 02/13/2020   Willa Rough DPT, ATC Willa Rough 05/13/2020, 5:17 PM  [place] MAIN [facility] SERVICES [address] [place], [place], [address] Phone: [phone]   Fax:  [phone]  Name: Colleen Mccarthy MRN: [id number] Date of Birth: 1996-06-08

## 2020-05-12 NOTE — Patient Instructions (Signed)
   Hold for 30 seconds (5 deep breaths) and repeat 2-3 times on each side once a day  MET to Correct a Left Anteriorly Rotated/Right Posteriorly Rotated Innominate using a dowel   Begin on your back with the knees and hips bent to 90 degrees. Use a dowel or broomstick to go through the legs. Have the dowel behind the left leg and in front of the right. Stabilize the dowel on ether side with the hands. Press down with the left leg and up with the right and hold for 5 seconds. Slowly release back to the starting position. Repeat 5 times.  Alternative: just push left heel into the ground and hold for 5 breaths, repeat 5 times.  Follow either version with 3 "plops" where you bridge up and then land on the ground quickly.      This is The QL muscle  To perform release on this muscle, start by getting into this position by bridging the hips up and then slowly lowering your back, then your butt down to lengthen the low back then put the ball under you where you feel the tender spot and roll to the same side slightly to add pressure as needed. Hold still and take deep breaths until the pain is at least 50% less or, ideally, just pressure.   This is your piriformis    To release this muscle start in this position with your ankle crossed over the opposite knee. Place the tennis ball under your buttock where the tender spot is and then slightly roll your weight to the same side to put just enough pressure that it is uncomfortable. Hold and take deep breaths until the pain is at least 50% less or, ideally ,just pressure.   This is your Gluteus Medius and Minimus  To perform release on this muscle, start by getting into this position by bridging the hips up and then slowly lowering your back, then your butt down to lengthen the low back then put the ball under you where you feel the tender spot and roll to the same side slightly to add pressure as needed. Hold still and take deep breaths until the pain  is at least 50% less or, ideally, just pressure.   These are your deep hip-flexor muscles. They are easiest to reach where they come together at the hip.   To perform release on this muscle, start by getting into this position by laying on your stomach then put the ball under you where you feel the tender spot and bring the opposite knee up/out to the side to add pressure as needed. Hold still and take deep breaths until the pain is at least 50% less or, ideally ,just pressure.

## 2020-05-14 ENCOUNTER — Inpatient Hospital Stay
Admission: EM | Admit: 2020-05-14 | Discharge: 2020-05-16 | DRG: 806 | Disposition: A | Discharge status: Home / Self Care | Payer: Medicaid Other

## 2020-05-14 ENCOUNTER — Encounter: Payer: Self-pay | Admitting: Obstetrics and Gynecology

## 2020-05-14 ENCOUNTER — Other Ambulatory Visit: Payer: Self-pay

## 2020-05-14 DIAGNOSIS — O9081 Anemia of the puerperium: Secondary | ICD-10-CM | POA: Diagnosis not present

## 2020-05-14 DIAGNOSIS — Z87891 Personal history of nicotine dependence: Secondary | ICD-10-CM | POA: Diagnosis not present

## 2020-05-14 DIAGNOSIS — O99824 Streptococcus B carrier state complicating childbirth: Secondary | ICD-10-CM | POA: Diagnosis present

## 2020-05-14 DIAGNOSIS — D62 Acute posthemorrhagic anemia: Secondary | ICD-10-CM | POA: Diagnosis not present

## 2020-05-14 DIAGNOSIS — Z20822 Contact with and (suspected) exposure to covid-19: Secondary | ICD-10-CM | POA: Diagnosis present

## 2020-05-14 DIAGNOSIS — Z3A38 38 weeks gestation of pregnancy: Secondary | ICD-10-CM

## 2020-05-14 DIAGNOSIS — O26893 Other specified pregnancy related conditions, third trimester: Secondary | ICD-10-CM | POA: Diagnosis present

## 2020-05-14 LAB — CBC
HCT: 27.1 % — ABNORMAL LOW (ref 36.0–46.0)
Hemoglobin: 9.7 g/dL — ABNORMAL LOW (ref 12.0–15.0)
MCH: 31.8 pg (ref 26.0–34.0)
MCHC: 35.8 g/dL (ref 30.0–36.0)
MCV: 88.9 fL (ref 80.0–100.0)
Platelets: 211 10*3/uL (ref 150–400)
RBC: 3.05 MIL/uL — ABNORMAL LOW (ref 3.87–5.11)
RDW: 12.8 % (ref 11.5–15.5)
WBC: 14.6 10*3/uL — ABNORMAL HIGH (ref 4.0–10.5)
nRBC: 0 % (ref 0.0–0.2)

## 2020-05-14 LAB — TYPE AND SCREEN
ABO/RH(D): A POS
Antibody Screen: NEGATIVE

## 2020-05-14 LAB — SARS CORONAVIRUS 2 BY RT PCR (HOSPITAL ORDER, PERFORMED IN ~~LOC~~ HOSPITAL LAB): SARS Coronavirus 2: NEGATIVE

## 2020-05-14 MED ORDER — SODIUM CHLORIDE 0.9 % IV SOLN: 5.0000 10*6.[IU] | Freq: Once | INTRAVENOUS | Status: DC

## 2020-05-14 MED ORDER — ONDANSETRON HCL 4 MG/2ML IJ SOLN: 4.0000 mg | Freq: Four times a day (QID) | INTRAMUSCULAR | Status: DC | PRN

## 2020-05-14 MED ORDER — PENICILLIN G POT IN DEXTROSE 60000 UNIT/ML IV SOLN
3.0000 10*6.[IU] | INTRAVENOUS | Status: DC
  Filled 2020-05-14 (×8): qty 50

## 2020-05-14 MED ORDER — SODIUM CHLORIDE 0.9 % IV SOLN
INTRAVENOUS | Status: AC
  Filled 2020-05-14: qty 5

## 2020-05-14 MED ORDER — LACTATED RINGERS IV SOLN: INTRAVENOUS | Status: DC

## 2020-05-14 MED ORDER — LACTATED RINGERS IV SOLN: 500.0000 mL | INTRAVENOUS | Status: DC | PRN

## 2020-05-14 MED ORDER — OXYTOCIN BOLUS FROM INFUSION: 333.0000 mL | Freq: Once | INTRAVENOUS | Status: DC

## 2020-05-14 MED ORDER — OXYTOCIN-SODIUM CHLORIDE 30-0.9 UT/500ML-% IV SOLN: 1.0000 m[IU]/min | INTRAVENOUS | Status: DC

## 2020-05-14 MED ORDER — LIDOCAINE HCL (PF) 1 % IJ SOLN
INTRAMUSCULAR | Status: AC
  Filled 2020-05-14: qty 30

## 2020-05-14 MED ORDER — SOD CITRATE-CITRIC ACID 500-334 MG/5ML PO SOLN: 30.0000 mL | ORAL | Status: DC | PRN

## 2020-05-14 MED ORDER — TERBUTALINE SULFATE 1 MG/ML IJ SOLN: 0.2500 mg | Freq: Once | INTRAMUSCULAR | Status: DC | PRN

## 2020-05-14 MED ORDER — LIDOCAINE HCL (PF) 1 % IJ SOLN: 30.0000 mL | INTRAMUSCULAR | Status: DC | PRN

## 2020-05-14 MED ORDER — ACETAMINOPHEN 500 MG PO TABS
1000.0000 mg | ORAL_TABLET | Freq: Four times a day (QID) | ORAL | Status: DC | PRN
  Administered 2020-05-15 (×2): 1000 mg via ORAL
  Filled 2020-05-14 (×2): qty 2

## 2020-05-14 MED ORDER — AMMONIA AROMATIC IN INHA
RESPIRATORY_TRACT | Status: AC
  Filled 2020-05-14: qty 10

## 2020-05-14 MED ORDER — MISOPROSTOL 100 MCG PO TABS
25.0000 ug | ORAL_TABLET | ORAL | Status: DC | PRN
  Filled 2020-05-14: qty 1

## 2020-05-14 MED ORDER — MISOPROSTOL 200 MCG PO TABS
ORAL_TABLET | ORAL | Status: AC
  Filled 2020-05-14: qty 4

## 2020-05-14 MED ORDER — OXYTOCIN 10 UNIT/ML IJ SOLN
INTRAMUSCULAR | Status: AC
  Administered 2020-05-14: 10 [IU]
  Filled 2020-05-14: qty 2

## 2020-05-14 MED ORDER — ACETAMINOPHEN 500 MG PO TABS
ORAL_TABLET | ORAL | Status: AC
  Administered 2020-05-14: 1000 mg via ORAL
  Filled 2020-05-14: qty 2

## 2020-05-14 MED ORDER — BENZOCAINE-MENTHOL 20-0.5 % EX AERO
1.0000 "application " | INHALATION_SPRAY | CUTANEOUS | Status: DC | PRN
  Administered 2020-05-16: 1 via TOPICAL
  Filled 2020-05-14: qty 56

## 2020-05-14 MED ORDER — IBUPROFEN 600 MG PO TABS
600.0000 mg | ORAL_TABLET | Freq: Four times a day (QID) | ORAL | Status: DC
  Administered 2020-05-14 – 2020-05-16 (×9): 600 mg via ORAL
  Filled 2020-05-14 (×9): qty 1

## 2020-05-14 MED ORDER — OXYTOCIN-SODIUM CHLORIDE 30-0.9 UT/500ML-% IV SOLN
INTRAVENOUS | Status: AC
  Filled 2020-05-14: qty 1000

## 2020-05-14 MED ORDER — OXYTOCIN-SODIUM CHLORIDE 30-0.9 UT/500ML-% IV SOLN: 2.5000 [IU]/h | INTRAVENOUS | Status: DC

## 2020-05-14 MED ORDER — FENTANYL CITRATE (PF) 100 MCG/2ML IJ SOLN: 50.0000 ug | INTRAMUSCULAR | Status: DC | PRN

## 2020-05-14 MED ORDER — BENZOCAINE-MENTHOL 20-0.5 % EX AERO
INHALATION_SPRAY | CUTANEOUS | Status: AC
  Administered 2020-05-14: 1 via TOPICAL
  Filled 2020-05-14: qty 56

## 2020-05-14 NOTE — Discharge Summary (Signed)
Obstetrical Discharge Summary  Patient Name: Colleen Mccarthy DOB: 1996-03-27 MRN: [id number]  Date of Admission: 05/14/2020 Date of Delivery: 05/14/2020 Delivered by: Margaretmary Eddy, CNM Date of Discharge: 05/16/2020  Primary OB: [facility]  LMP:No LMP recorded (lmp unknown). Patient is pregnant. EDC Estimated Date of Delivery: 05/22/20 Gestational Age at Delivery: [redacted]w[redacted]d   Antepartum complications:  1. S<D with poor weight gain  2. Tobacco use in pregnancy  3. Chlamydia and gonorrhea infection in pregnancy - 01/2020 treated, TOC negative 4. GBS positive   5. Varicella non-immune  Admitting Diagnosis: active labor   Secondary Diagnosis: Patient Active Problem List   Diagnosis Date Noted  . Labor and delivery indication for care or intervention 05/14/2020  . Vaginal bleeding in pregnancy, second trimester 02/13/2020  . Chlamydia infection affecting pregnancy 02/13/2020  . Encounter for supervision of normal pregnancy in multigravida 06/17/2013    Augmentation: N/A Complications: Precipitous labor, GBS positive - not treated  Intrapartum complications/course: Colleen Mccarthy presented to L&D in active labor.  She progressed quickly to C/C/+1 with visible bulging membranes.  She had a spontaneous urge to push in hands and knees.  AROM was performed right before delivery for meconium stained fluid.  She pushed effectively over 2 contractions, delivery in hands and knees of a viable baby boy.    Delivery Type: spontaneous vaginal delivery Anesthesia: epidural Placenta: spontaneous Laceration: none  Episiotomy: none Newborn Data: Live born female  Birth Weight: 6 lb 0.7 oz (2740 g) APGAR: 8, 9  Newborn Delivery   Birth date/time: 05/14/2020 10:27:00 Delivery type: Vaginal, Spontaneous     Postpartum Procedures: None [place]: No flowsheet data found.   Post partum course:  Patient had an uncomplicated postpartum course.  By time of discharge on PPD#2, her pain was controlled on  oral pain medications; she had appropriate lochia and was ambulating, voiding without difficulty and tolerating regular diet.  She was deemed stable for discharge to home.     Discharge Physical Exam:  BP 109/66 (BP Location: Left Arm)   Pulse 70   Temp 98.1 F (36.7 C) (Oral)   Resp 20   Ht 5\' 4"  (1.626 m)   Wt 77.1 kg   LMP  (LMP Unknown)   SpO2 100%   BMI 29.18 kg/m   General: NAD CV: RRR Pulm: CTABL, nl effort ABD: s/nd/nt, fundus firm and below the umbilicus Lochia: moderate Perineum: minimal edema/intact  DVT Evaluation: LE non-ttp, no evidence of DVT on exam.  Hemoglobin  Date Value Ref Range Status  05/15/2020 7.9 (L) 12.0 - 15.0 g/dL Final   HCT  Date Value Ref Range Status  05/15/2020 23.5 (L) 36 - 46 % Final     Disposition: stable, discharge to home. Baby Feeding: breastmilk and formula Baby Disposition: home with mom  Rh Immune globulin given: Rh pos Rubella vaccine given: Immune  Varivax vaccine given: Offered postpartum  Flu vaccine given in AP or PP setting: N/A, due in season  Tdap vaccine given in AP or PP setting:  Given 03/11/2020  Contraception: Depo provera - given prior to discharge   Prenatal Labs:  Blood type/Rh A pos   Antibody screen neg  Rubella Immune  Varicella Non-Immune  RPR NR  HBsAg Neg  HIV NR  GC neg  Chlamydia neg  Genetic screening negative  1 hour GTT 97  3 hour GTT N/A  GBS Positive     Plan:  Colleen Mccarthy was discharged to home in good condition. Follow-up appointment with delivering  provider in 6 weeks.  Discharge Medications: Allergies as of 05/16/2020      Reactions   Hydrocodone Other (See Comments)   Patient describes seizure like activity with administration. Happened 7 years.       Medication List    STOP taking these medications   betamethasone acetate-betamethasone sodium phosphate 6 (3-3) MG/ML injection Commonly known as: CELESTONE     TAKE these medications   acetaminophen 500 MG  tablet Commonly known as: TYLENOL Take 2 tablets (1,000 mg total) by mouth every 6 (six) hours as needed (for pain scale < 4).   ferrous sulfate 325 (65 FE) MG tablet Take 1 tablet (325 mg total) by mouth 2 (two) times daily with a meal.   ibuprofen 600 MG tablet Commonly known as: ADVIL Take 1 tablet (600 mg total) by mouth every 6 (six) hours.   multivitamin-prenatal 27-0.8 MG Tabs tablet Take 1 tablet by mouth daily at 12 noon.        Follow-up Information    Gustavo Lah, CNM. Schedule an appointment as soon as possible for a visit in 6 week(s).   Specialty: Certified Nurse Midwife Why: postpartum visit  Contact information: [address] [place] [address] [phone]               Signed:  Margaretmary Eddy, CNM Certified Nurse Midwife [place]  [facility]

## 2020-05-14 NOTE — Progress Notes (Signed)
Pt. Presented to L/D triage with reported contractions that began last night and have increased in frequency and intensity. She describes the pain as intermittent, rated 10/10. She reports no bleeding, but bright red spotting noted since her cervical exam yesterday. Positive fetal movement stated. Monitors applied and assessing; VSS.

## 2020-05-14 NOTE — H&P (Signed)
OB History & Physical   History of Present Illness:  Chief Complaint:   HPI:  Colleen Mccarthy is a 24 y.o. [id number] female at [redacted]w[redacted]d dated by [place] at 109w1d, inconsistent with LMP of 07/27/2019.  She presents to L&D for active labor   Active FM onset of ctx @ 0000 currently every 2 minutes Denies LOF  bloody show present   Pregnancy Issues: 1. S<D with poor weight gain  2. Tobacco use in pregnancy  3. Chlamydia and gonorrhea infection in pregnancy - 01/2020 treated, TOC negative 4. GBS positive   5. Varicella non-immune   Patient Active Problem List   Diagnosis Date Noted  . Labor and delivery indication for care or intervention 05/14/2020  . Vaginal bleeding in pregnancy, second trimester 02/13/2020  . Chlamydia infection affecting pregnancy 02/13/2020  . Encounter for supervision of normal pregnancy in multigravida 06/17/2013     Maternal Medical History:   Past Medical History:  Diagnosis Date  . Drug abuse (HCC) 02/11/2015  . Medical history non-contributory     History reviewed. No pertinent surgical history.  Allergies  Allergen Reactions  . Vicodin [Hydrocodone-Acetaminophen]     Patient describes seizure like activity with administration.  Happened 7 years ago    Prior to Admission medications   Medication Sig Start Date End Date Taking? Authorizing Provider  Prenatal Vit-Fe Fumarate-FA (MULTIVITAMIN-PRENATAL) 27-0.8 MG TABS tablet Take 1 tablet by mouth daily at 12 noon.   Yes [provider]  betamethasone acetate-betamethasone sodium phosphate (CELESTONE) 6 (3-3) MG/ML injection Inject 2 mLs (12 mg total) into the muscle every 24 hours x 2 doses. 02/14/20   Gustavo Lah, CNM     Prenatal care site:  [facility]   Social History: She  reports that she quit smoking about 2 months ago. Her smoking use included cigarettes. She started smoking about 9 years ago. She smoked 0.50 packs per day. She has never used smokeless tobacco. She reports  previous alcohol use. She reports previous drug use.  Family History: family history includes Cancer in her paternal grandmother.   Review of Systems: A full review of systems was performed and negative except as noted in the HPI.     Physical Exam:  Vital Signs: BP 125/74 (BP Location: Left Arm)   Pulse 90   Resp 18   Ht 5\' 4"  (1.626 m)   Wt 78.5 kg   LMP  (LMP Unknown)   BMI 29.70 kg/m  Physical Exam  General: breathing heavily with contractions, reports urge to push   HEENT: normocephalic, atraumatic Heart: regular rate & rhythm.  No murmurs/rubs/gallops Lungs: clear to auscultation bilaterally, normal respiratory effort Abdomen: soft, gravid, non-tender;  EFW: 5 1/2 lbs  Pelvic:   External: Normal external female genitalia  Cervix: Dilation: 10 / Effacement (%): 100 / Station: -1    Extremities: non-tender, symmetric, no edema bilaterally.  DTRs: 2+/2+  Neurologic: Alert & oriented x 3.    No results found for this or any previous visit (from the past 24 hour(s)).  Pertinent Results:  Prenatal Labs: Blood type/Rh A pos   Antibody screen neg  Rubella Immune  Varicella Non-Immune  RPR NR  HBsAg Neg  HIV NR  GC neg  Chlamydia neg  Genetic screening negative  1 hour GTT 97  3 hour GTT N/A  GBS Positive    FHT: 160bpm, moderate variability, accels present  TOCO: strong contractions every 2 min SVE:  Dilation: 10 / Effacement (%): 100 /  Station: -1    Cephalic by SVE   US OB Follow Up  Result Date: 04/25/2020 CLINICAL DATA:  Pregnancy.  Evaluate fetal growth. EXAM: OBSTETRIC 14+ WK ULTRASOUND FOLLOW-UP FINDINGS: Number of Fetuses: 1 Heart Rate:  136 bpm Movement: Yes Presentation: Cephalic Previa: No Placental Location: Anterior Amniotic Fluid (Subjective): Normal Amniotic Fluid (Objective): Vertical pocket 5.4cm AFI 13.0 cm (5%ile= 7.7 cm, 95%= 25 cm for 36 wks) FETAL BIOMETRY BPD:  8.9cm 36w 1d.  (62nd percentile) HC:    31.4cm 35w 2d (8th percentile) AC:     30.2cm 34w 1d (12th percentile) FL:    6.4cm 32w 6d (1st percentile) Current Mean GA: 34w 3d [place] EDC: 06/02/2020 Assigned GA: 36w 0d Assigned EDC: 05/22/2020 Estimated Fetal Weight:  2,348g 20%ile FETAL ANATOMY Fetal anatomic survey has been previously completed. See second trimester ultrasound reports. Technical Limitations: None. Maternal Findings: Cervix:  3.6 cm IMPRESSION: 1. Single live intrauterine gestation with assigned gestational age of [redacted] weeks 0 days. 2. Estimated fetal weight now measures in the 20th percentile (previously 52nd percentile at 23 weeks). 3. Fetal biometry measurements with percentiles, as above. 4. Amniotic fluid index of 13 cm, within normal limits. Electronically Signed   By: Duanne Guess D.O.   On: 04/25/2020 20:07    Assessment:  Colleen Mccarthy is a 24 y.o. [id number] female at [redacted]w[redacted]d with active labor.   Plan:  1. Admit to Labor & Delivery; consents reviewed and obtained - advanced labor, LDR room prepared for immanent delivery   2. Fetal Well being  - Fetal Tracing: Cat 1 - Group B Streptococcus ppx indicated: - unable to give abx d/t no IV access and immanent delivery  - Presentation: cephalic confirmed by SVE   3. Routine OB: - Prenatal labs reviewed, as above - Rh pos - CBC, T&S, RPR on admit - Clear fluids  - No IV d/t advanced labor   4. Monitoring of Labor -  Contractions monitored by palpation  -  Pelvis adequate for labor  -  Expectant management - AROM with visible bulging bag  -  Intermittent monitoring during pushing  -  Maternal pain control as desired - unable to get epidural d/t advanced labor  - Anticipate vaginal delivery  5. Post Partum Planning: - Infant feeding: breast/formula - Contraception: Depo - Flu - due in season - Tdap - Given 03/11/2020  Gustavo Lah, CNM 05/14/20 10:55 AM  Margaretmary Eddy, CNM Certified Nurse Midwife [place]  [facility]

## 2020-05-15 LAB — CBC
HCT: 23.5 % — ABNORMAL LOW (ref 36.0–46.0)
Hemoglobin: 7.9 g/dL — ABNORMAL LOW (ref 12.0–15.0)
MCH: 30.7 pg (ref 26.0–34.0)
MCHC: 33.6 g/dL (ref 30.0–36.0)
MCV: 91.4 fL (ref 80.0–100.0)
Platelets: 178 10*3/uL (ref 150–400)
RBC: 2.57 MIL/uL — ABNORMAL LOW (ref 3.87–5.11)
RDW: 12.7 % (ref 11.5–15.5)
WBC: 12.4 10*3/uL — ABNORMAL HIGH (ref 4.0–10.5)
nRBC: 0 % (ref 0.0–0.2)

## 2020-05-15 MED ORDER — PRENATAL MULTIVITAMIN CH
1.0000 | ORAL_TABLET | Freq: Every day | ORAL | Status: DC
  Administered 2020-05-15 – 2020-05-16 (×2): 1 via ORAL
  Filled 2020-05-15 (×2): qty 1

## 2020-05-15 MED ORDER — SIMETHICONE 80 MG PO CHEW: 80.0000 mg | CHEWABLE_TABLET | ORAL | Status: DC | PRN

## 2020-05-15 MED ORDER — DIPHENHYDRAMINE HCL 25 MG PO CAPS: 25.0000 mg | ORAL_CAPSULE | Freq: Four times a day (QID) | ORAL | Status: DC | PRN

## 2020-05-15 MED ORDER — DOCUSATE SODIUM 100 MG PO CAPS
100.0000 mg | ORAL_CAPSULE | Freq: Two times a day (BID) | ORAL | Status: DC
  Administered 2020-05-15 (×2): 100 mg via ORAL
  Filled 2020-05-15 (×3): qty 1

## 2020-05-15 MED ORDER — FERROUS SULFATE 325 (65 FE) MG PO TABS
325.0000 mg | ORAL_TABLET | Freq: Two times a day (BID) | ORAL | Status: DC
  Administered 2020-05-16: 325 mg via ORAL
  Filled 2020-05-15: qty 1

## 2020-05-15 MED ORDER — DIBUCAINE (PERIANAL) 1 % EX OINT: 1.0000 "application " | TOPICAL_OINTMENT | CUTANEOUS | Status: DC | PRN

## 2020-05-15 MED ORDER — VARICELLA VIRUS VACCINE LIVE 1350 PFU/0.5ML IJ SUSR
0.5000 mL | INTRAMUSCULAR | Status: DC | PRN
  Filled 2020-05-15: qty 0.5

## 2020-05-15 MED ORDER — WITCH HAZEL-GLYCERIN EX PADS
1.0000 "application " | MEDICATED_PAD | CUTANEOUS | Status: DC
  Administered 2020-05-15 – 2020-05-16 (×2): 1 via TOPICAL
  Filled 2020-05-15 (×2): qty 100

## 2020-05-15 MED ORDER — COCONUT OIL OIL: 1.0000 "application " | TOPICAL_OIL | Status: DC | PRN

## 2020-05-15 MED ORDER — ONDANSETRON HCL 4 MG/2ML IJ SOLN: 4.0000 mg | INTRAMUSCULAR | Status: DC | PRN

## 2020-05-15 MED ORDER — ONDANSETRON HCL 4 MG PO TABS: 4.0000 mg | ORAL_TABLET | ORAL | Status: DC | PRN

## 2020-05-15 NOTE — TOC Initial Note (Signed)
Transition of Care [facility]) - Initial/Assessment Note    Patient Details  Name: Colleen Mccarthy MRN: [id number] Date of Birth: 10/19/1995  Transition of Care [facility]) CM/SW Contact:    Trenton Cellar, RN Phone Number: 05/15/2020, 12:23 PM  Clinical Narrative:                 Spoke to patient at bedside. Patient was eating breakfast and holding infant in bed. 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 Patient was planning to bottlefeed however infant latched to breast and mother states it was so simple she will try and do both. Infant has follow up appointment at [facility] and mother will schedule follow up appointment for herself as well. Discussed PPD and SIDS in detail. Patient states she has no needs or concerns at this time and has a strong support system although patient is unclear if FOB will be involved at this time.         Patient Goals and CMS Choice        Expected Discharge Plan and Services                                                Prior Living Arrangements/Services                       Activities of Daily Living Home Assistive Devices/Equipment: None ADL Screening (condition at time of admission) Patient's cognitive ability adequate to safely complete daily activities?: Yes Is the patient deaf or have difficulty hearing?: No Does the patient have difficulty seeing, even when wearing glasses/contacts?: No Does the patient have difficulty concentrating, remembering, or making decisions?: No Patient able to express need for assistance with ADLs?: Yes Does the patient have difficulty dressing or bathing?: No Independently performs ADLs?: Yes (appropriate for developmental age) Does the patient have difficulty walking or climbing stairs?: No Weakness of Legs: None Weakness of Arms/Hands: None  Permission Sought/Granted                  Emotional Assessment              Admission diagnosis:  Labor and delivery  indication for care or intervention [O75.9] Patient Active Problem List   Diagnosis Date Noted  . Labor and delivery indication for care or intervention 05/14/2020  . Vaginal bleeding in pregnancy, second trimester 02/13/2020  . Chlamydia infection affecting pregnancy 02/13/2020  . Encounter for supervision of normal pregnancy in multigravida 06/17/2013   PCP:  [facility] Pharmacy:   [facility] - [place], [place] - [address] [place] [address] Phone: [phone] Fax: [phone]     Social Determinants of Health (SDOH) Interventions    Readmission Risk Interventions No flowsheet data found.

## 2020-05-15 NOTE — Progress Notes (Signed)
Post Partum Day 1  Subjective: no complaints, up ad lib, voiding and tolerating PO  Doing well, no concerns. Ambulating without difficulty, pain managed with PO meds, tolerating regular diet, and voiding without difficulty.   No fever/chills, chest pain, shortness of breath, nausea/vomiting, or leg pain. No nipple or breast pain. No headache, visual changes, or RUQ/epigastric pain.  Objective: BP 100/73   Pulse 78   Temp 98.3 F (36.8 C) (Oral)   Resp 20   Ht 5\' 4"  (1.626 m)   Wt 77.1 kg   LMP  (LMP Unknown)   SpO2 100%   BMI 29.18 kg/m    Physical Exam:  General: alert, cooperative and no distress Breasts: soft/nontender CV: RRR Pulm: nl effort, CTABL Abdomen: soft, non-tender, active bowel sounds Uterine Fundus: firm Perineum: minimal edema, intact Lochia: appropriate DVT Evaluation: No evidence of DVT seen on physical exam.  Recent Labs    05/14/20 1058 05/15/20 0903  HGB 9.7* 7.9*  HCT 27.1* 23.5*  WBC 14.6* 12.4*  PLT 211 178    Assessment/Plan: 24 y.o. 05/17/20 postpartum day # 1  -Continue routine postpartum care -Lactation consult PRN for breastfeeding.  Originally planned to formula feed but has tried breastfeeding intermittently.   -Not adequately treated for GBS in labor, will stay for 48 hours to observe infant.  -Discussed contraceptive options including implant, IUDs hormonal and non-hormonal, injection, pills/ring/patch, condoms, and NFP. Would like Depo Provera.  -Acute blood loss anemia - hemodynamically stable and asymptomatic; start PO ferrous sulfate BID with stool softeners  -Immunization status: Needs varicella prior to discharge  Disposition: Continue inpatient postpartum care, plan for discharge tomorrow    LOS: 1 day   [id number], CNM 05/15/2020, 1:53 PM   ----- 05/17/2020  Certified Nurse Midwife [place] [facility]

## 2020-05-16 MED ORDER — ACETAMINOPHEN 500 MG PO TABS: 1000.0000 mg | ORAL_TABLET | Freq: Four times a day (QID) | ORAL | 0 refills | Status: AC | PRN

## 2020-05-16 MED ORDER — FERROUS SULFATE 325 (65 FE) MG PO TABS: 325.0000 mg | ORAL_TABLET | Freq: Two times a day (BID) | ORAL | 2 refills | Status: AC

## 2020-05-16 MED ORDER — MEDROXYPROGESTERONE ACETATE 150 MG/ML IM SUSP
150.0000 mg | INTRAMUSCULAR | Status: DC | PRN
  Filled 2020-05-16 (×3): qty 1

## 2020-05-16 MED ORDER — IBUPROFEN 600 MG PO TABS: 600.0000 mg | ORAL_TABLET | Freq: Four times a day (QID) | ORAL | 1 refills | Status: AC

## 2020-05-16 NOTE — Discharge Instructions (Signed)
Discharge Instructions:   Follow-up Appointment: Call the office ASAP and schedule a follow-up appointment with Margaretmary Eddy, CNM for a visit in 6 weeks at [facility]!   If there are any new medications, they have been ordered and will be available for pickup at the listed pharmacy on your way home from the hospital.   Call office if you have any of the following: headache, visual changes, fever >101.0 F, chills, shortness of breath, breast concerns, excessive vaginal bleeding, incision drainage or problems, leg pain or redness, depression or any other concerns. If you have vaginal discharge with an odor, let your doctor know.   It is normal to bleed for up to 6 weeks. You should not soak through more than 1 pad in 1 hour. If you have a blood clot larger than your fist with continued bleeding, call your doctor.   Activity: Do not lift > 10 lbs for 6 weeks (do not lift anything heavier than your baby). No intercourse, tampons, swimming pools, hot tubs, baths (only showers) for 6 weeks.  No driving for 1-2 weeks. Continue prenatal vitamin, especially if breastfeeding. Increase calories and fluids (water) while breastfeeding.   Your milk will come in, in the next couple of days (right now it is colostrum). You may have a slight fever when your milk comes in, but it should go away on its own.  If it does not, and rises above 101 F please call the doctor. You will also feel achy and your breasts will be firm. They will also start to leak. If you are breastfeeding, continue as you have been and you can pump/express milk for comfort.   If you have too much milk, your breasts can become engorged, which could lead to mastitis. This is an infection of the milk ducts. It can be very painful and you will need to notify your doctor to obtain a prescription for antibiotics. You can also treat it with a shower or hot/cold compress.   For concerns about your baby, please call your pediatrician.  For  breastfeeding concerns, the lactation consultant can be reached at [phone].   Postpartum blues (feelings of happy one minute and sad another minute) are normal for the first few weeks but if it gets worse let your doctor know.   Congratulations! We enjoyed caring for you and your new bundle of joy!     Vaginal Delivery, Care After Refer to this sheet in the next few weeks. These discharge instructions provide you with information on caring for yourself after delivery. Your caregiver may also give you specific instructions. Your treatment has been planned according to the most current medical practices available, but problems sometimes occur. Call your caregiver if you have any problems or questions after you go home. HOME CARE INSTRUCTIONS 1. Take over-the-counter or prescription medicines only as directed by your caregiver or pharmacist. 2. Do not drink alcohol, especially if you are breastfeeding or taking medicine to relieve pain. 3. Do not smoke tobacco. 4. Continue to use good perineal care. Good perineal care includes: 1. Wiping your perineum from back to front 2. Keeping your perineum clean. 3. You can do sitz baths twice a day, to help keep this area clean 5. Do not use tampons, douche or have sex until your caregiver says it is okay. 6. Shower only and avoid sitting in submerged water, aside from sitz baths 7. Wear a well-fitting bra that provides breast support. 8. Eat healthy foods. 9. Drink enough fluids  to keep your urine clear or pale yellow. 10. Eat high-fiber foods such as whole grain cereals and breads, brown rice, beans, and fresh fruits and vegetables every day. These foods may help prevent or relieve constipation. 11. Avoid constipation with high fiber foods or medications, such as miralax or metamucil 12. Follow your caregiver's recommendations regarding resumption of activities such as climbing stairs, driving, lifting, exercising, or traveling. 13. Talk to your  caregiver about resuming sexual activities. Resumption of sexual activities is dependent upon your risk of infection, your rate of healing, and your comfort and desire to resume sexual activity. 14. Try to have someone help you with your household activities and your newborn for at least a few days after you leave the hospital. 15. Rest as much as possible. Try to rest or take a nap when your newborn is sleeping. 16. Increase your activities gradually. 17. Keep all of your scheduled postpartum appointments. It is very important to keep your scheduled follow-up appointments. At these appointments, your caregiver will be checking to make sure that you are healing physically and emotionally. SEEK MEDICAL CARE IF:   You are passing large clots from your vagina. Save any clots to show your caregiver.  You have a foul smelling discharge from your vagina.  You have trouble urinating.  You are urinating frequently.  You have pain when you urinate.  You have a change in your bowel movements.  You have increasing redness, pain, or swelling near your vaginal incision (episiotomy) or vaginal tear.  You have pus draining from your episiotomy or vaginal tear.  Your episiotomy or vaginal tear is separating.  You have painful, hard, or reddened breasts.  You have a severe headache.  You have blurred vision or see spots.  You feel sad or depressed.  You have thoughts of hurting yourself or your newborn.  You have questions about your care, the care of your newborn, or medicines.  You are dizzy or light-headed.  You have a rash.  You have nausea or vomiting.  You were breastfeeding and have not had a menstrual period within 12 weeks after you stopped breastfeeding.  You are not breastfeeding and have not had a menstrual period by the 12th week after delivery.  You have a fever. SEEK IMMEDIATE MEDICAL CARE IF:   You have persistent pain.  You have chest pain.  You have shortness of  breath.  You faint.  You have leg pain.  You have stomach pain.  Your vaginal bleeding saturates two or more sanitary pads in 1 hour. MAKE SURE YOU:   Understand these instructions.  Will watch your condition.  Will get help right away if you are not doing well or get worse. Document Released: 09/09/2000 Document Revised: 01/27/2014 Document Reviewed: 05/09/2012 [facility] Patient Information 2015 [place], [place]. This information is not intended to replace advice given to you by your health care provider. Make sure you discuss any questions you have with your health care provider.  Sitz Bath A sitz bath is a warm water bath taken in the sitting position. The water covers only the hips and butt (buttocks). We recommend using one that fits in the toilet, to help with ease of use and cleanliness. It may be used for either healing or cleaning purposes. Sitz baths are also used to relieve pain, itching, or muscle tightening (spasms). The water may contain medicine. Moist heat will help you heal and relax.  HOME CARE  Take 3 to 4 sitz baths a day.  18. Fill the bathtub half-full with warm water. 19. Sit in the water and open the drain a little. 20. Turn on the warm water to keep the tub half-full. Keep the water running constantly. 21. Soak in the water for 15 to 20 minutes. 22. After the sitz bath, pat the affected area dry. GET HELP RIGHT AWAY IF: You get worse instead of better. Stop the sitz baths if you get worse. MAKE SURE YOU:  Understand these instructions.  Will watch your condition.  Will get help right away if you are not doing well or get worse. Document Released: 10/20/2004 Document Revised: 06/06/2012 Document Reviewed: 01/10/2011 [facility] Patient Information 2015 [place], [place]. This information is not intended to replace advice given to you by your health care provider. Make sure you discuss any questions you have with your health care provider.

## 2020-05-16 NOTE — Progress Notes (Signed)
Patient discharged home with infant. Discharge instructions and prescriptions given and reviewed with patient. Patient verbalized understanding.   Encouraged pt to call on Monday to [facility] and schedule follow-up appointment for a visit in 6 weeks with Margaretmary Eddy, CNM!   Pt refused depo shot at discharge when nurse went to give it. She stated last time she received it in the hospital she had several side effects and would rather get it a her follow-up appointment.   Escorted out by staff.

## 2020-05-17 LAB — RPR: RPR Ser Ql: NONREACTIVE — AB

## 2020-05-19 ENCOUNTER — Ambulatory Visit: Payer: Medicaid Other

## 2020-05-19 ENCOUNTER — Other Ambulatory Visit: Payer: Self-pay

## 2020-05-19 DIAGNOSIS — M533 Sacrococcygeal disorders, not elsewhere classified: Secondary | ICD-10-CM | POA: Diagnosis present

## 2020-05-19 DIAGNOSIS — M62838 Other muscle spasm: Secondary | ICD-10-CM | POA: Diagnosis present

## 2020-05-19 DIAGNOSIS — R293 Abnormal posture: Secondary | ICD-10-CM

## 2020-05-19 NOTE — Patient Instructions (Signed)

## 2020-05-19 NOTE — Therapy (Signed)
[place] [facility] SERVICES [address] [place], [place], [address] Phone: [phone]   Fax:  [phone]  Physical Therapy Treatment  The patient has been informed of current processes in place at Outpatient Rehab to protect patients from Covid-19 exposure including social distancing, schedule modifications, and new cleaning procedures. After discussing their particular risk with a therapist based on the patient's personal risk factors, the patient has decided to proceed with in-person therapy.  Patient Details  Name: Colleen Mccarthy MRN: [id number] Date of Birth: July 07, 1996 No data recorded  Encounter Date: 05/19/2020   PT End of Session - 05/20/20 0843    Visit Number 2    Number of Visits 4    Date for PT Re-Evaluation 06/09/20    Authorization Type [facility]    Authorization Time Period 05/12/20 through 06/09/20    Authorization - Visit Number 2    Authorization - Number of Visits 4    Progress Note Due on Visit 4    PT Start Time 1630    PT Stop Time 1730    PT Time Calculation (min) 60 min    Activity Tolerance Patient tolerated treatment well;No increased pain    Behavior During Therapy WFL for tasks assessed/performed           Past Medical History:  Diagnosis Date  . Drug abuse ([place]) 02/11/2015  . Medical history non-contributory     No past surgical history on file.  There were no vitals filed for this visit.    Pelvic Floor Physical Therapy Treatment Note  SCREENING  Changes in medications, allergies, or medical history?: Vaginal delivery on 05/14/20    SUBJECTIVE  Patient reports: She had a spontaneous delivery on the 19th. She was able to stay home for most of the labor. She had a very quick labor once she got to the hospital and delivered in a quadruped position.   Precautions:  5 days PP.  Pain update:  Location of pain: B hips, lower abdomen and down to the knee B Current pain: 6/10  Max pain:  10+/10 Least pain: 0/10 Nature of pain:Dull ache with shooting/stabbing pains  **3/10 following treatment  Patient Goals: Just be able to do her daily activities without needing assistance from the kids.   OBJECTIVE  Changes in: Posture/Observations:  LLE long by ~ 1cm, slight R anterior innominate rotation.  Range of Motion/Flexibilty:    Strength/MMT:  LE MMT:  Pelvic floor:  Abdominal:   Palpation: TTP to B Iliacus and L QL  Gait Analysis: Antalgic, slow, wide stance, lurching to the R to advance LLE.   **following treatment, ~75% improved but Pt. Unable to use heel-lift in current shoes to assess improvement with lift.  INTERVENTIONS THIS SESSION: Manual: Performed TP release to B Iliacus and L QL followed by MET correction x2 and L up-slip correction x2 to decrease spasm and pain and allow for improved balance of musculature, and pelvic alignment for improved function and decreased symptoms.  Self-care: Given heel-lift for RLE, educated on why it will help reduce her pain and educated on how to gradually increase wear time with heel-lift to prevent increased pain during adjustment period.  Total time: 60 min.                              PT Short Term Goals - 05/13/20 1705      PT SHORT TERM GOAL #1  Title Patient will demonstrate improved pelvic alignment and balance of musculature surrounding the pelvis to facilitate decreased PFM spasms and decrease pelvic pain.    Baseline R Up-slip and L anterior rotation    Time 1    Period Weeks    Status New    Target Date 05/20/20      PT SHORT TERM GOAL #2   Title Patient will demonstrate HEP x1 in the clinic to demonstrate understanding and proper form to allow for further improvement.    Baseline Pt. lacks knowledge of what therapeutic exercises will allow for decreased pain/Sx.    Time 1    Period Weeks    Status New    Target Date 05/20/20             PT Long Term Goals  - 05/13/20 1708      PT LONG TERM GOAL #1   Title Patient will describe pain no greater than 2/10 during ADL's including cooking dinner to demonstrate improved functional ability.    Baseline Pain as high as 10/10 when cooking a simple meal such as spaghetti    Time 2    Period Weeks    Status New    Target Date 05/27/20      PT LONG TERM GOAL #2   Title Patient will demonstrate a coordinated contraction, relaxation, and bulge of the pelvic floor muscles to demonstrate functional recruitment and motion and allow for successful PFM coordination to decrease likelihood of complication or tearing with delivery.    Baseline Pt. has had pelvic pain for ~ 3 months, indicating likely PFM spasms. Has not had intercourse to know if it would be painful. Due date is less than 2 weeks away.    Time 2    Period Weeks    Status New    Target Date 05/27/20      PT LONG TERM GOAL #3   Title Patient's FOTO score will improve by 10 points to demonstrate improved function.    Baseline FOTO PFDI Pain: 65    Time 2    Period Weeks    Status New    Target Date 05/27/20                 Plan - 05/20/20 0845    Clinical Impression Statement Pt. Responded well to all interventions today, demonstrating improved pelvic alignment, decreased spasm, and 50% pain reduction, as well as understanding and correct performance of all education and exercises provided today. They will continue to benefit from skilled physical therapy to work toward remaining goals and maximize function as well as decrease likelihood of symptom increase or recurrence.    PT Next Visit Plan re-assess pelvic alignment and teach pelvic tilts, perform TP release to rectus PRN and assess pubic symphysis alignment    PT Home Exercise Plan heel-lift for RLE    Consulted and Agree with Plan of Care Patient           Patient will benefit from skilled therapeutic intervention in order to improve the following deficits and impairments:      Visit Diagnosis: Abnormal posture  Other muscle spasm  Sacrococcygeal disorders, not elsewhere classified     Problem List Patient Active Problem List   Diagnosis Date Noted  . Labor and delivery indication for care or intervention 05/14/2020  . Vaginal bleeding in pregnancy, second trimester 02/13/2020  . Chlamydia infection affecting pregnancy 02/13/2020  . Encounter for supervision of normal pregnancy in multigravida 06/17/2013  Willa Rough DPT, ATC Willa Rough 05/20/2020, 8:56 AM  [place] MAIN [facility] SERVICES [address] [place], [place], [address] Phone: [phone]   Fax:  [phone]  Name: Colleen Mccarthy MRN: [id number] Date of Birth: 13-Sep-1996

## 2020-05-26 ENCOUNTER — Ambulatory Visit: Payer: Medicaid Other

## 2020-06-02 ENCOUNTER — Ambulatory Visit: Payer: Medicaid Other

## 2020-06-04 ENCOUNTER — Ambulatory Visit: Payer: Medicaid Other | Attending: Certified Nurse Midwife

## 2020-06-08 ENCOUNTER — Ambulatory Visit: Payer: Medicaid Other

## 2020-06-09 ENCOUNTER — Ambulatory Visit: Payer: Medicaid Other

## 2020-06-16 ENCOUNTER — Ambulatory Visit: Payer: Medicaid Other

## 2020-06-30 ENCOUNTER — Ambulatory Visit: Payer: Medicaid Other

## 2020-07-07 ENCOUNTER — Ambulatory Visit: Payer: Medicaid Other

## 2020-07-14 ENCOUNTER — Ambulatory Visit: Payer: Medicaid Other

## 2020-07-21 ENCOUNTER — Ambulatory Visit: Payer: Medicaid Other

## 2020-07-28 ENCOUNTER — Ambulatory Visit: Payer: Medicaid Other

## 2020-12-28 ENCOUNTER — Telehealth: Payer: Self-pay | Admitting: Family Medicine

## 2020-12-28 NOTE — Telephone Encounter (Signed)
Pt called to try and set up [facility] appointments for her two children, who have been out of school for two weeks.

## 2021-01-25 ENCOUNTER — Telehealth: Payer: Self-pay | Admitting: Nurse Practitioner

## 2021-01-25 NOTE — Telephone Encounter (Signed)
Mother had called in April seeking a child health appointment for her child.  At that time mother was informed that the health department was not providing well child checks but possibly would resume in May.  Telephone call to mother today informing her of open slots for well child checks.  Left message with grandmother to give [place] a call back if appointment is still needed. Glenna Fellows, RN

## 2021-02-28 IMAGING — US US OB FOLLOW-UP
1 series · 13 of 28 positions shown · non-contrast
Comparison: none

CLINICAL DATA: Pregnancy.  Follow-up anatomy

EXAM:
OBSTETRIC 14+ WK ULTRASOUND FOLLOW-UP

[Series 1: us ob follow up · 13 of 83 slices shown]
[im 4/83]
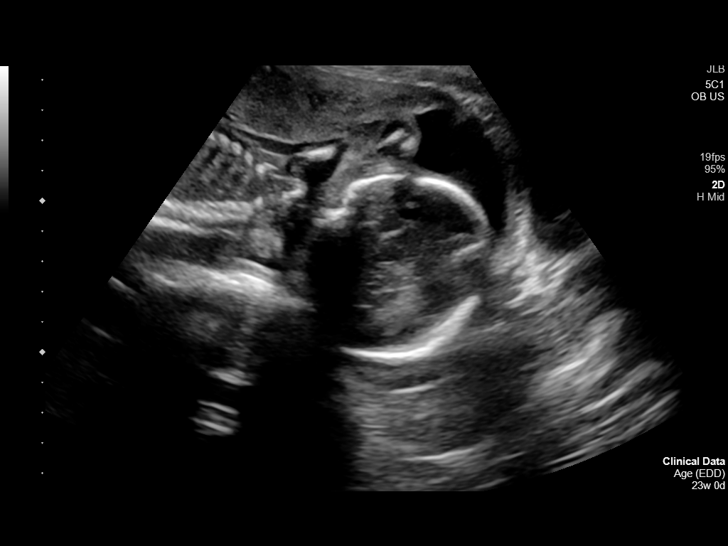
[im 10/83]
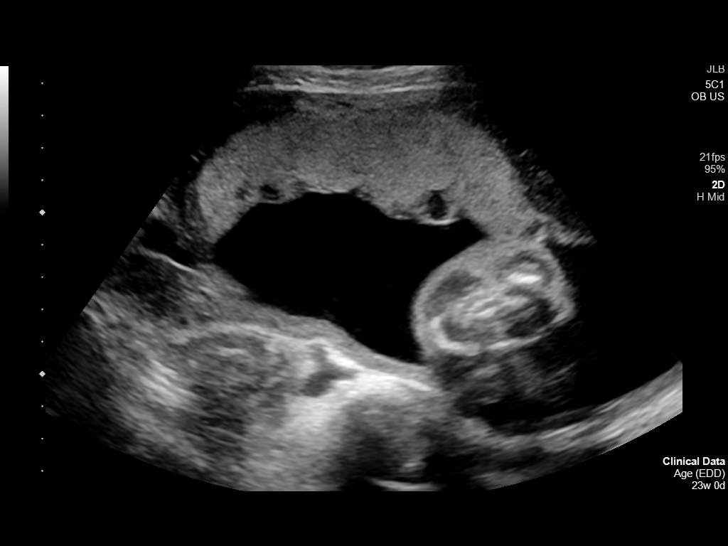
[im 16/83]
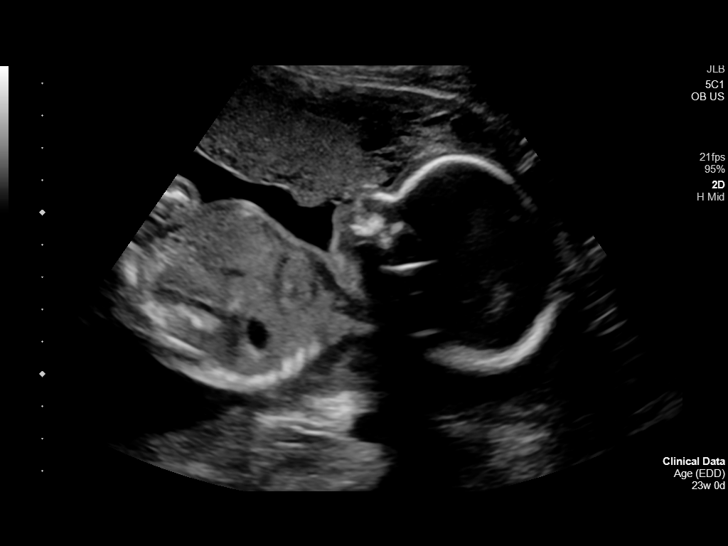
[im 22/83]
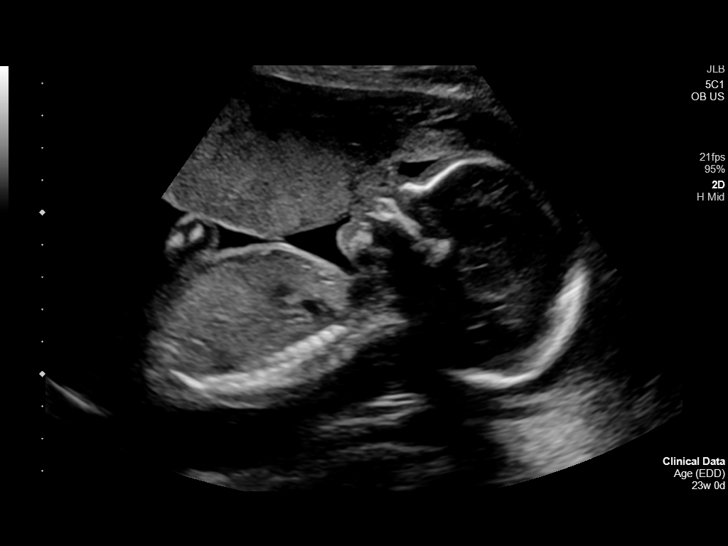
[im 28/83]
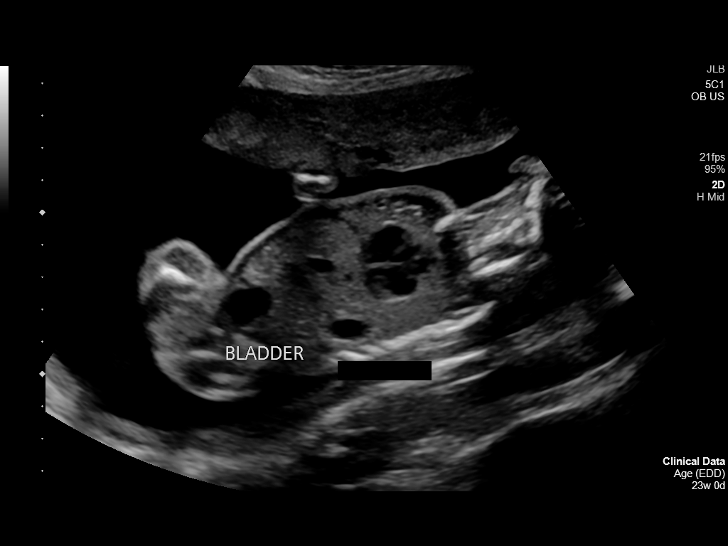
[im 34/83]
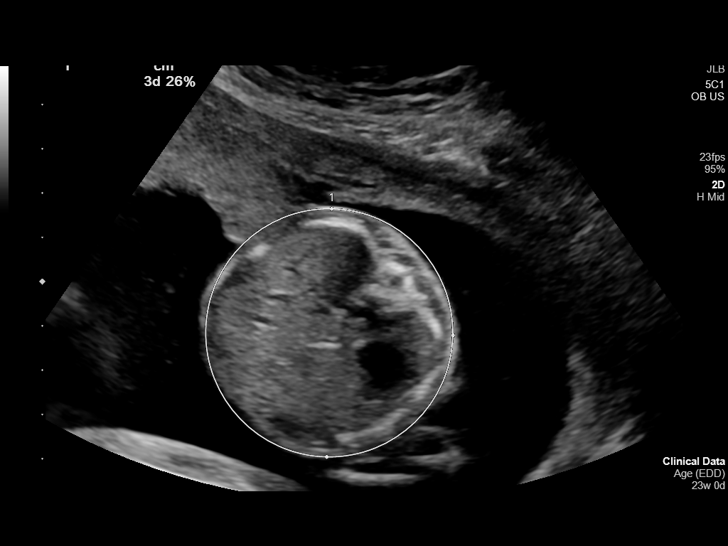
[im 43/83]
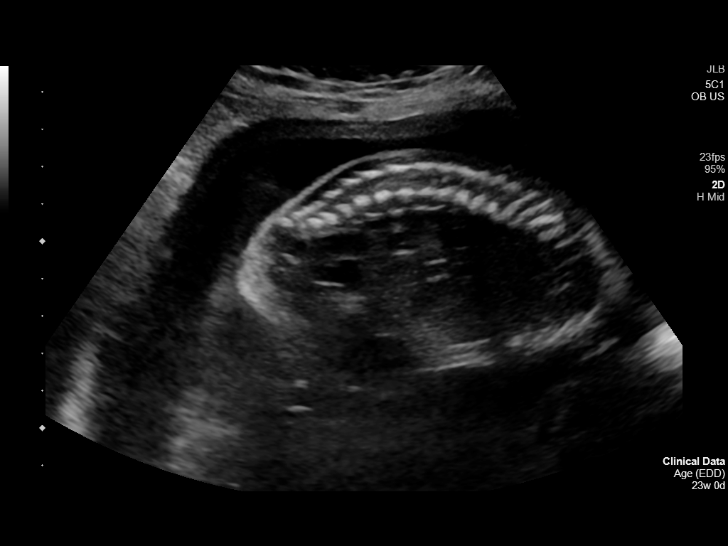
[im 49/83]
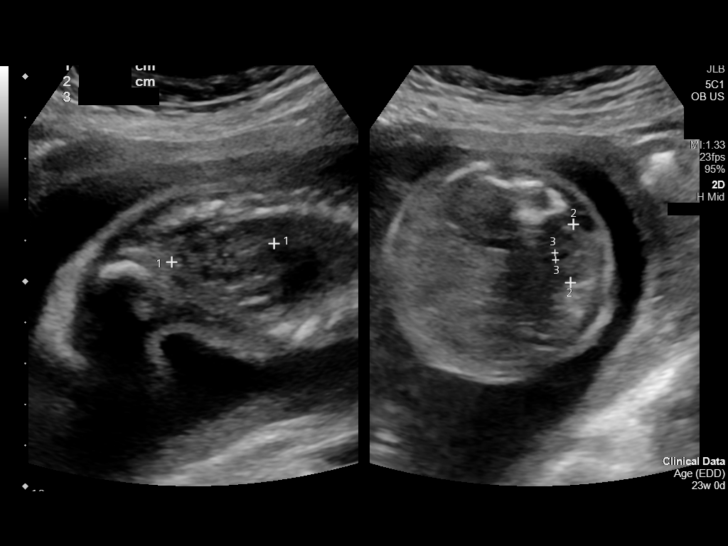
[im 55/83]
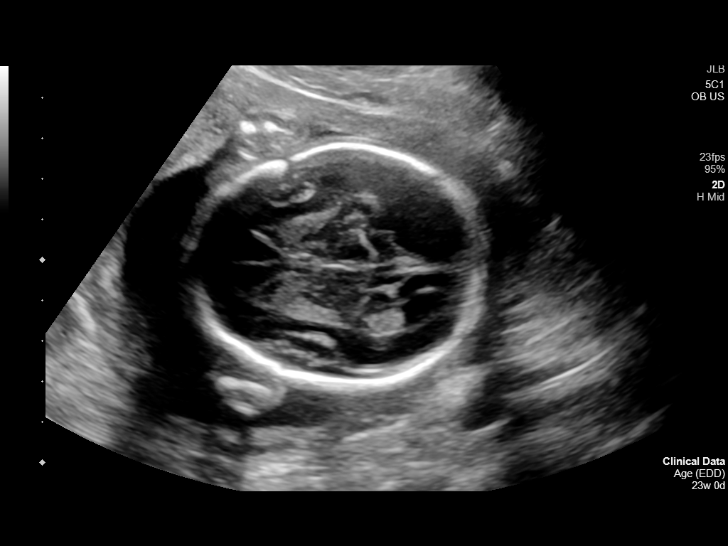
[im 61/83]
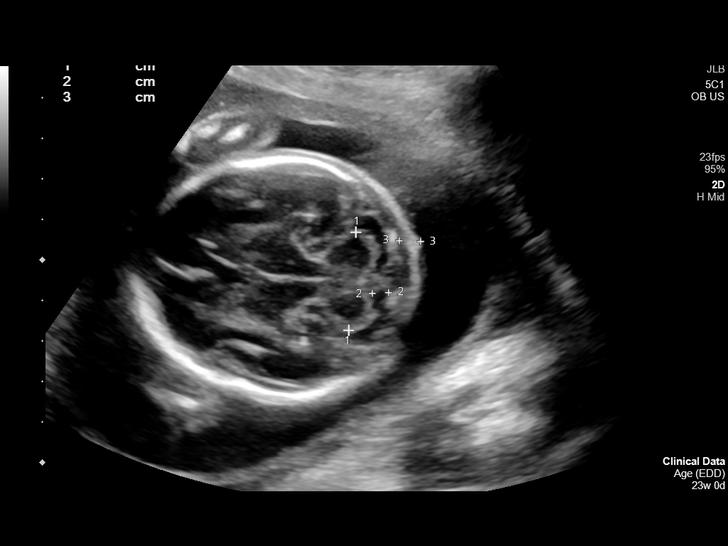
[im 67/83]
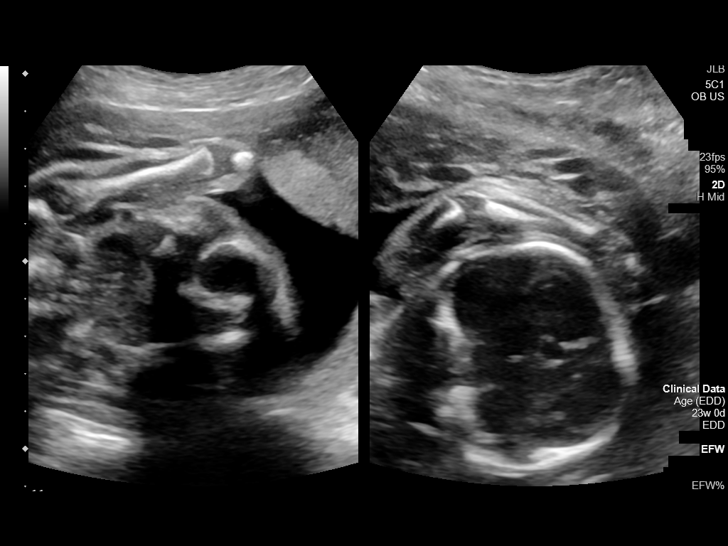
[im 73/83]
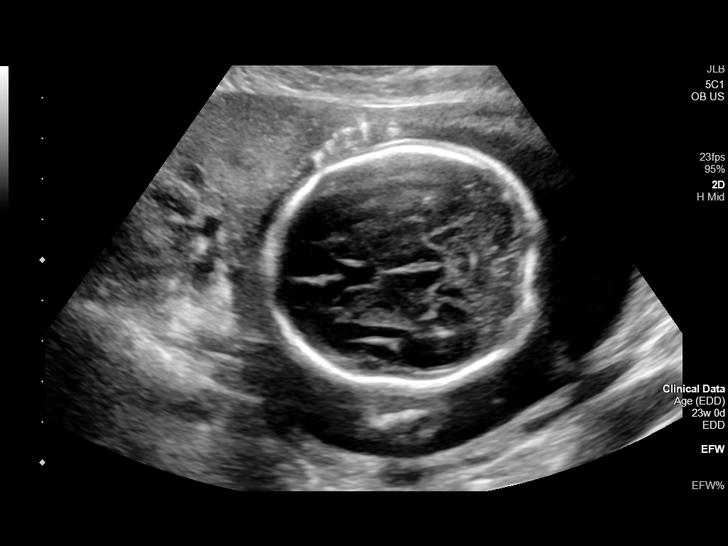
[im 79/83]
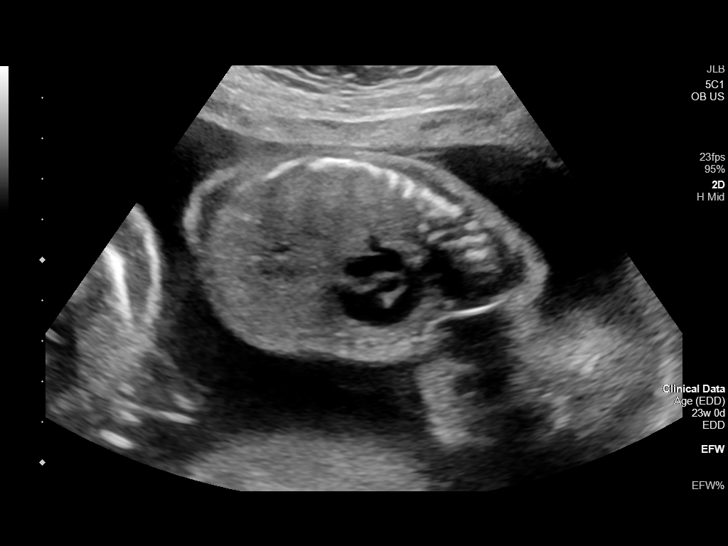

[13 of 28 positions shown; findings below may reference images not displayed]

FINDINGS: Number of Fetuses: 1

Heart Rate:  155 bpm

Movement: Yes

Presentation: Cephalic-variable

Previa: No

Placental Location: Anterior

Amniotic Fluid (Subjective): Normal

Amniotic Fluid (Objective):

Vertical pocket 6.3cm

FETAL BIOMETRY

BPD:  5.9cm 24w 0d

HC:    21.5cm 23w 4d

AC:    17.9cm 22w 5d

FL:    4.2cm 23w 4d

Current Mean GA: 23w 3d US EDC: 05/19/2020

Assigned GA: 23w 0d Assigned EDC: 05/22/2020

Estimated Fetal Weight:  571g 52%ile

FETAL ANATOMY

Lateral Ventricles: Previously seen

Thalami/CSP: Previously seen

Posterior Fossa: Appears normal

Nuchal Region: Appears normal

Upper Lip: Appears normal

Spine: Appears normal

4 Chamber Heart on Left: Appears normal

LVOT: Appears normal

RVOT: Appears normal

Stomach on Left: Previously seen

3 Vessel Cord: Previously seen

Cord Insertion site: Previously seen

Kidneys: Appears normal

Bladder: Previously seen

Extremities: Previously seen

Sex: Male

Technical Limitations: None

Maternal Findings:

Cervix:  3.3 cm
IMPRESSION: 1. Single live intrauterine gestation with assigned gestational age
of 23 weeks 0 days. Adequate interval growth.
2. Complete fetal anatomic survey, as detailed above. No fetal
anomaly identified.
3. Estimated fetal weight is in the 52nd percentile.

## 2021-08-06 ENCOUNTER — Emergency Department
Admission: EM | Admit: 2021-08-06 | Discharge: 2021-08-06 | Disposition: A | Discharge status: Home / Self Care | Payer: Medicaid Other | Attending: Emergency Medicine | Admitting: Emergency Medicine

## 2021-08-06 ENCOUNTER — Emergency Department: Payer: Medicaid Other

## 2021-08-06 DIAGNOSIS — J101 Influenza due to other identified influenza virus with other respiratory manifestations: Secondary | ICD-10-CM | POA: Insufficient documentation

## 2021-08-06 DIAGNOSIS — J111 Influenza due to unidentified influenza virus with other respiratory manifestations: Secondary | ICD-10-CM

## 2021-08-06 DIAGNOSIS — Z87891 Personal history of nicotine dependence: Secondary | ICD-10-CM | POA: Diagnosis not present

## 2021-08-06 DIAGNOSIS — Z20822 Contact with and (suspected) exposure to covid-19: Secondary | ICD-10-CM | POA: Insufficient documentation

## 2021-08-06 DIAGNOSIS — R059 Cough, unspecified: Secondary | ICD-10-CM | POA: Diagnosis present

## 2021-08-06 LAB — URINALYSIS, ROUTINE W REFLEX MICROSCOPIC
Bacteria, UA: NONE SEEN
Bilirubin Urine: NEGATIVE
Glucose, UA: NEGATIVE mg/dL
Hgb urine dipstick: NEGATIVE
Ketones, ur: 5 mg/dL — AB
Leukocytes,Ua: NEGATIVE
Nitrite: NEGATIVE
Protein, ur: 30 mg/dL — AB
Specific Gravity, Urine: 1.026 (ref 1.005–1.030)
pH: 5 (ref 5.0–8.0)

## 2021-08-06 LAB — RESP PANEL BY RT-PCR (FLU A&B, COVID) ARPGX2
Influenza A by PCR: POSITIVE — AB
Influenza B by PCR: NEGATIVE
SARS Coronavirus 2 by RT PCR: NEGATIVE

## 2021-08-06 LAB — POC URINE PREG, ED: Preg Test, Ur: NEGATIVE

## 2021-08-06 MED ORDER — ONDANSETRON 4 MG PO TBDP: 4.0000 mg | ORAL_TABLET | Freq: Three times a day (TID) | ORAL | 0 refills | Status: AC | PRN

## 2021-08-06 MED ORDER — ONDANSETRON 4 MG PO TBDP
4.0000 mg | ORAL_TABLET | Freq: Once | ORAL | Status: AC
  Administered 2021-08-06: 4 mg via ORAL
  Filled 2021-08-06: qty 1

## 2021-08-06 MED ORDER — KETOROLAC TROMETHAMINE 30 MG/ML IJ SOLN
30.0000 mg | Freq: Once | INTRAMUSCULAR | Status: AC
  Administered 2021-08-06: 30 mg via INTRAMUSCULAR
  Filled 2021-08-06: qty 1

## 2021-08-06 MED ORDER — ACETAMINOPHEN 500 MG PO TABS
1000.0000 mg | ORAL_TABLET | Freq: Once | ORAL | Status: AC
  Administered 2021-08-06: 1000 mg via ORAL
  Filled 2021-08-06: qty 2

## 2021-08-06 MED ORDER — OSELTAMIVIR PHOSPHATE 75 MG PO CAPS: 75.0000 mg | ORAL_CAPSULE | Freq: Two times a day (BID) | ORAL | 0 refills | Status: AC

## 2021-08-06 NOTE — ED Provider Notes (Signed)
[facility] Emergency Department Provider Note  ____________________________________________   Event Date/Time   First MD Initiated Contact with Patient 08/06/21 0515     (approximate)  I have reviewed the triage vital signs    HISTORY  Chief Complaint Fever    HPI Colleen Mccarthy is a 25 y.o. female who presents with viral symptoms.  Mom states that all of her children have the flu.  She states that she started having symptoms on Wednesday, constant, nothing makes it better or worse.  She reports having mostly a cough and a mild headache.  Contrary to triage note she did not have a flu positive test yet     Past Medical History:  Diagnosis Date   Drug abuse (HCC) 02/11/2015   Medical history non-contributory     Patient Active Problem List   Diagnosis Date Noted   Labor and delivery indication for care or intervention 05/14/2020   Vaginal bleeding in pregnancy, second trimester 02/13/2020   Chlamydia infection affecting pregnancy 02/13/2020   Encounter for supervision of normal pregnancy in multigravida 06/17/2013    No past surgical history on file.  Prior to Admission medications   Medication Sig Start Date End Date Taking? Authorizing Provider  acetaminophen (TYLENOL) 500 MG tablet Take 2 tablets (1,000 mg total) by mouth every 6 (six) hours as needed (for pain scale < 4). 05/16/20   Gustavo Lah, CNM  ferrous sulfate 325 (65 FE) MG tablet Take 1 tablet (325 mg total) by mouth 2 (two) times daily with a meal. 05/16/20   Gustavo Lah, CNM  ibuprofen (ADVIL) 600 MG tablet Take 1 tablet (600 mg total) by mouth every 6 (six) hours. 05/16/20   Gustavo Lah, CNM  Prenatal Vit-Fe Fumarate-FA (MULTIVITAMIN-PRENATAL) 27-0.8 MG TABS tablet Take 1 tablet by mouth daily at 12 noon.    [provider]    Allergies Hydrocodone  Family History  Problem Relation Age of Onset   Cancer Paternal Grandmother     Social History Social  History   Tobacco Use   Smoking status: Former    Packs/day: 0.50    Types: Cigarettes    Start date: 05/13/2011    Quit date: 03/12/2020    Years since quitting: 1.4   Smokeless tobacco: Never  Substance Use Topics   Alcohol use: Not Currently    Comment: occasionally   Drug use: Not Currently      Review of Systems Constitutional: No fever/chills Eyes: No visual changes. ENT: No sore throat.  Positive congestion Cardiovascular: Denies chest pain. Respiratory: Denies severe shortness of breath, + cough Gastrointestinal: No abdominal pain.  No nausea, no vomiting.  No diarrhea.  No constipation. Genitourinary: Negative for dysuria. Musculoskeletal: Negative for back pain. Skin: Negative for rash. Neurological: Positive headache, no focal weakness or numbness. All other ROS negative ____________________________________________   PHYSICAL EXAM:  VITAL SIGNS: ED Triage Vitals  Enc Vitals Group     BP 08/06/21 0508 (!) 143/100     Pulse Rate 08/06/21 0508 (!) 115     Resp 08/06/21 0508 17     Temp 08/06/21 0508 100.1 F (37.8 C)     Temp Source 08/06/21 0508 Oral     SpO2 08/06/21 0508 99 %     Weight 08/06/21 0509 154 lb 15.7 oz (70.3 kg)     Height 08/06/21 0509 5\' 1"  (1.549 m)     Head Circumference --      Peak Flow --  Pain Score --      Pain Loc --      Pain Edu? --      Excl. in GC? --     Constitutional: Alert and oriented. Well appearing and in no acute distress. Eyes: Conjunctivae are normal. EOMI. Head: Atraumatic. Nose: No congestion/rhinnorhea. Mouth/Throat: Mucous membranes are moist.   Neck: No stridor. Trachea Midline. FROM Cardiovascular: Tachycardic, regular rhythm. Good peripheral circulation. Respiratory: no audible stridor, no increased work of breathing  Gastrointestinal: Soft and nontender. No distention.  Musculoskeletal: No lower extremity tenderness nor edema.  No joint effusions. Neurologic:  Normal speech and language. No  gross focal neurologic deficits are appreciated.  Skin:  Skin is warm, dry and intact. No rash noted. Psychiatric: Mood and affect are normal. Speech and behavior are normal. GU: Deferred   ____________________________________________   LABS (all labs ordered are listed, but only abnormal results are displayed)  Labs Reviewed  RESP PANEL BY RT-PCR (FLU A&B, COVID) [id number]  URINALYSIS, ROUTINE W REFLEX MICROSCOPIC  POC URINE PREG, ED   ____________________________________________   RADIOLOGY Vela Prose, personally viewed and evaluated these images (plain radiographs) as part of my medical decision making, as well as reviewing the written report by the radiologist.  ED MD interpretation: No pneumonia  Official radiology report(s): DG Chest 2 View  Result Date: 08/06/2021 CLINICAL DATA:  25 year old female with history of cough.  Fever. EXAM: CHEST - 2 VIEW COMPARISON:  No priors. FINDINGS: Lung volumes are normal. No consolidative airspace disease. No pleural effusions. No pneumothorax. No pulmonary nodule or mass noted. Pulmonary vasculature and the cardiomediastinal silhouette are within normal limits. IMPRESSION: No radiographic evidence of acute cardiopulmonary disease. Electronically Signed   By: Trudie Reed M.D.   On: 08/06/2021 06:00    ____________________________________________   PROCEDURES  Procedure(s) performed (including Critical Care):  Procedures   ____________________________________________   INITIAL IMPRESSION / ASSESSMENT AND PLAN / ED COURSE  Colleen Mccarthy was evaluated in Emergency Department on 08/06/2021 for the symptoms described in the history of present illness. She was evaluated in the context of the global COVID-19 pandemic, which necessitated consideration that the patient might be at risk for infection with the SARS-CoV-2 virus that causes COVID-19. Institutional protocols and algorithms that pertain to the evaluation of patients at  risk for COVID-19 are in a state of rapid change based on information released by regulatory bodies including the CDC and federal and state organizations. These policies and algorithms were followed during the patient's care in the ED.     Pt presents with multiple symptoms.  I suspect this is most likely related to the flu.  However we will also get COVID, RSV testing.  We will get chest x-ray to make sure no pneumonia.  Pt very well appearing. Well hydrated on exam, low suspicion for electrolyte abnormalities or AKI. Pt not hypoxic and breathing well therefore does not require admission to hospital.  Low suspicion of PE/ACS given no chest pain/SOB and so well appearing.  Will proceed with COVID testing.  Will give some Toradol, Tylenol to help with symptoms  Patient reports complete resolution of symptoms with medications  Pregnancy test was negative.  Patient's heart rates have come down flu test was positive.  Patient did not have kidney function done today but reports that she is otherwise normally healthy and denies any kidney issues previously.  Patient does report that her symptoms started within 48 hours so we will give her a course of  Tamiflu and Zofran to help with symptoms.  I discussed the provisional nature of ED diagnosis, the treatment so far, the ongoing plan of care, follow up appointments and return precautions with the patient and any family or support people present. They expressed understanding and agreed with the plan, discharged home.    ____________________________________________   FINAL CLINICAL IMPRESSION(S) / ED DIAGNOSES   Final diagnoses:  Flu      MEDICATIONS GIVEN DURING THIS VISIT:  Medications  ketorolac (TORADOL) 30 MG/ML injection 30 mg (has no administration in time range)  ondansetron (ZOFRAN-ODT) disintegrating tablet 4 mg (has no administration in time range)  acetaminophen (TYLENOL) tablet 1,000 mg (has no administration in time range)      ED Discharge Orders     None        Note:  This document was prepared using Dragon voice recognition software and may include unintentional dictation errors.   Concha Se, MD 08/06/21 [phone]

## 2021-08-06 NOTE — Discharge Instructions (Signed)
You take Tylenol 1 g every 8 hours to help with headache, fevers and ibuprofen 600 every 6-8 hours.  She can take the Tamiflu to help decrease how long her symptoms lasted for but is important to go pick it up as soon as you are discharged and start today.  It can cause some side effects like nausea or vomiting so I prescribed a prescription for Zofran.  If you feel like it is making her symptoms worse then you can stop taking it but is exposed to decrease how long your symptoms last.

## 2021-08-06 NOTE — ED Notes (Signed)
Declined d/c vitals, states "thank god, im ready to go home, I want some food". Verbalized understanding of d/c instructions. Denies questions or concerns. Ambulatory, NAD noted

## 2021-08-06 NOTE — ED Notes (Signed)
Pt resting comfortably at this time, RR Even and unlabored. Stretcher locked in lowest position

## 2021-08-06 NOTE — ED Triage Notes (Signed)
Pt here with 2 of her  kids for feeling sick and fever. Tested Flu+ last Monday . Pt presents to ED AAOx4, respi even-unlabored.

## 2022-09-11 IMAGING — CR DG CHEST 2V
2 series · 2 of 2 positions shown · non-contrast
Comparison: No priors.

CLINICAL DATA: 25-year-old female with history of cough.  Fever.

EXAM:
CHEST - 2 VIEW

[chest pa]
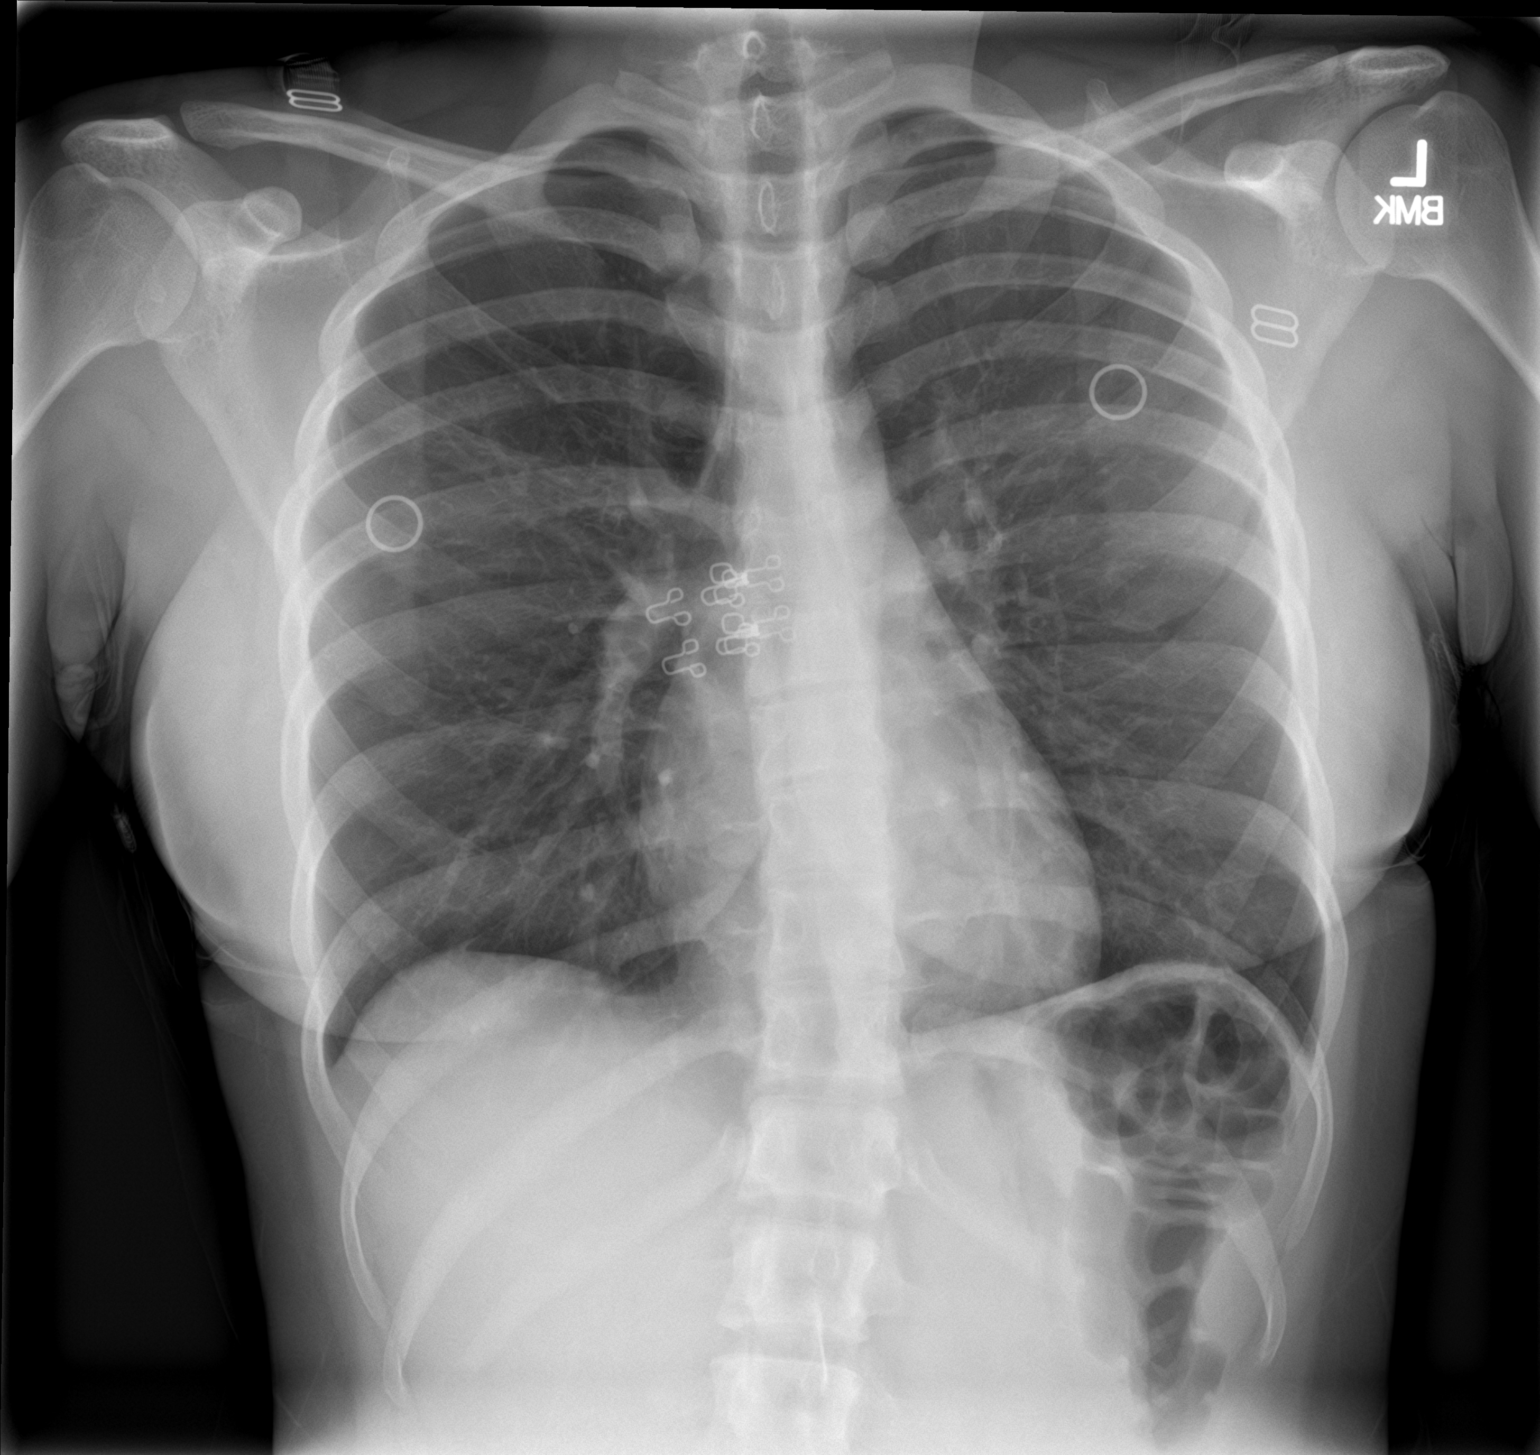

[chest lat]
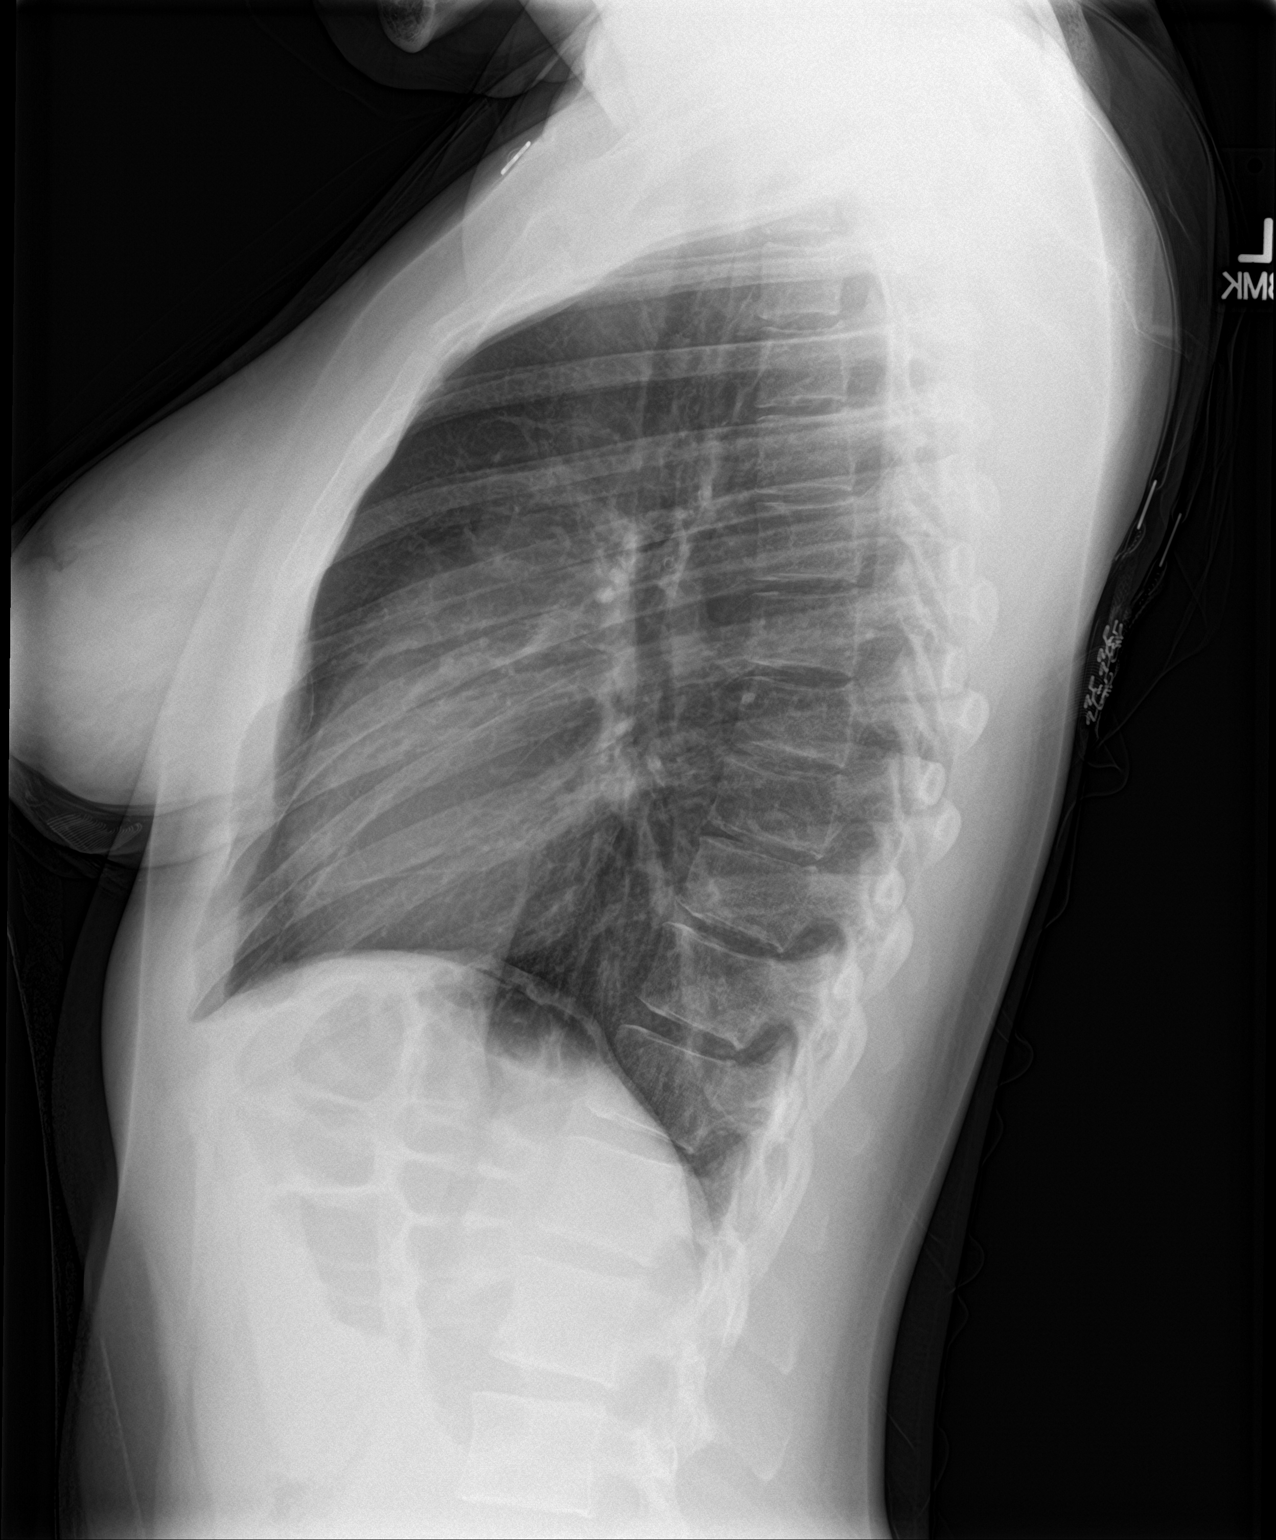

[2 of 2 positions shown; findings below may reference images not displayed]

FINDINGS: Lung volumes are normal. No consolidative airspace disease. No
pleural effusions. No pneumothorax. No pulmonary nodule or mass
noted. Pulmonary vasculature and the cardiomediastinal silhouette
are within normal limits.
IMPRESSION: No radiographic evidence of acute cardiopulmonary disease.

## 2023-03-04 ENCOUNTER — Other Ambulatory Visit: Payer: Self-pay

## 2023-03-04 ENCOUNTER — Emergency Department
Admission: EM | Admit: 2023-03-04 | Discharge: 2023-03-04 | Disposition: A | Discharge status: Home / Self Care | Payer: Medicaid Other | Attending: Emergency Medicine | Admitting: Emergency Medicine

## 2023-03-04 ENCOUNTER — Emergency Department: Payer: Medicaid Other

## 2023-03-04 DIAGNOSIS — R519 Headache, unspecified: Secondary | ICD-10-CM

## 2023-03-04 MED ORDER — DROPERIDOL 2.5 MG/ML IJ SOLN
2.5000 mg | Freq: Once | INTRAMUSCULAR | Status: AC
  Administered 2023-03-04: 2.5 mg via INTRAVENOUS
  Filled 2023-03-04: qty 2

## 2023-03-04 MED ORDER — BUTALBITAL-APAP-CAFFEINE 50-325-40 MG PO TABS: 1.0000 | ORAL_TABLET | ORAL | 0 refills | Status: AC | PRN

## 2023-03-04 NOTE — ED Triage Notes (Signed)
Pt to ed from home via [facility] reference headache. Pt is caox4,, in no acute distress in triage. Pt did take OTC tylenol with no relief.  Pt also has bilateral hip pain x 2 years.  113BGL 142/92 84HR  98%

## 2023-03-04 NOTE — Discharge Instructions (Signed)
You may take Fioricet as needed for headache.  Return to the ER for worsening symptoms, persistent vomiting, difficulty breathing or other concerns. °

## 2023-03-04 NOTE — ED Provider Notes (Signed)
Care of this patient assumed from prior physician at 0700 pending metabolization and anticipated discharge. Please see prior physician note for further details.  Briefly, this is a 27 year old female who presented with 1 day of headache without focal deficits.  Head CT reassuring.  Patient was given IM droperidol.  On reevaluation, she does report improvement in her headache.  Prescription for Fioricet sent by prior physician.  Patient comfortable with discharge home.  Strict return precautions provided.  Patient discharged in stable condition.   Trinna Post, MD 03/04/23 [phone]

## 2023-03-04 NOTE — ED Provider Notes (Signed)
[facility] Provider Note    Event Date/Time   First MD Initiated Contact with Patient 03/04/23 [phone]     (approximate)   History   Headache   HPI  Colleen Mccarthy is a 27 y.o. female to ED via EMS from home with chief complaint of frontal headache x 1 day.  Took Tylenol without relief of symptoms.  History of substance abuse but denies recent illicit drug use.  Denies vision changes, neck pain, fever/chills, chest pain, shortness of breath, abdominal pain, vomiting or dizziness.  Endorses nausea.  Denies trauma or head injury.     Past Medical History   Past Medical History:  Diagnosis Date   Drug abuse (HCC) 02/11/2015   Medical history non-contributory      Active Problem List   Patient Active Problem List   Diagnosis Date Noted   Labor and delivery indication for care or intervention 05/14/2020   Vaginal bleeding in pregnancy, second trimester 02/13/2020   Chlamydia infection affecting pregnancy 02/13/2020   Encounter for supervision of normal pregnancy in multigravida 06/17/2013     Past Surgical History  History reviewed. No pertinent surgical history.   Home Medications   Prior to Admission medications   Medication Sig Start Date End Date Taking? Authorizing Provider  butalbital-acetaminophen-caffeine (FIORICET) 50-325-40 MG tablet Take 1 tablet by mouth every 4 (four) hours as needed for headache. 03/04/23  Yes Irean Hong, MD  acetaminophen (TYLENOL) 500 MG tablet Take 2 tablets (1,000 mg total) by mouth every 6 (six) hours as needed (for pain scale < 4). 05/16/20   Gustavo Lah, CNM  ferrous sulfate 325 (65 FE) MG tablet Take 1 tablet (325 mg total) by mouth 2 (two) times daily with a meal. 05/16/20   Gustavo Lah, CNM  ibuprofen (ADVIL) 600 MG tablet Take 1 tablet (600 mg total) by mouth every 6 (six) hours. 05/16/20   Gustavo Lah, CNM  ondansetron (ZOFRAN ODT) 4 MG disintegrating tablet Take 1 tablet (4 mg total) by mouth every 8  (eight) hours as needed for nausea or vomiting. 08/06/21   Concha Se, MD  Prenatal Vit-Fe Fumarate-FA (MULTIVITAMIN-PRENATAL) 27-0.8 MG TABS tablet Take 1 tablet by mouth daily at 12 noon.    [provider]     Allergies  Hydrocodone   Family History   Family History  Problem Relation Age of Onset   Cancer Paternal Grandmother      Physical Exam  Triage Vital Signs: ED Triage Vitals  Enc Vitals Group     BP 03/04/23 0040 126/83     Pulse Rate 03/04/23 0040 (!) 105     Resp 03/04/23 0040 16     Temp 03/04/23 0040 99.1 F (37.3 C)     Temp Source 03/04/23 0040 Oral     SpO2 03/04/23 0040 98 %     Weight --      Height 03/04/23 0041 5\' 1"  (1.549 m)     Head Circumference --      Peak Flow --      Pain Score 03/04/23 0041 10     Pain Loc --      Pain Edu? --      Excl. in GC? --     Updated Vital Signs: BP 110/67   Pulse 88   Temp 99.1 F (37.3 C) (Oral)   Resp 14   Ht 5\' 1"  (1.549 m)   SpO2 98%   BMI 29.28 kg/m  General: Awake, mild distress.  Histrionic in my presence; calms once she is alone CV:  RRR.  Good peripheral perfusion.  Resp:  Normal effort.  CTAB. Abd:  Nontender.  No distention.  Other:  Head is atraumatic.  PERRL.  EOMI.  No carotid bruits.  Supple neck without meningismus.  Alert and oriented x 3.  CN II-XII grossly intact.  5/5 motor strength and sensation all extremities.  Ambulatory with steady gait.   ED Results / Procedures / Treatments  Labs (all labs ordered are listed, but only abnormal results are displayed) Labs Reviewed - No data to display   EKG  None   RADIOLOGY I have independently visualized and interpreted patient's CT as well as noted the radiology interpretation:  CT head: Negative  Official radiology report(s): CT Head Wo Contrast  Result Date: 03/04/2023 CLINICAL DATA:  Headache. EXAM: CT HEAD WITHOUT CONTRAST TECHNIQUE: Contiguous axial images were obtained from the base of the skull through  the vertex without intravenous contrast. RADIATION DOSE REDUCTION: This exam was performed according to the departmental dose-optimization program which includes automated exposure control, adjustment of the mA and/or kV according to patient size and/or use of iterative reconstruction technique. COMPARISON:  None Available. FINDINGS: Brain: No evidence of acute infarction, hemorrhage, hydrocephalus, extra-axial collection or mass lesion/mass effect. Vascular: No hyperdense vessel or unexpected calcification. Skull: Normal. Negative for fracture or focal lesion. Sinuses/Orbits: No acute finding. IMPRESSION: Negative head CT. Electronically Signed   By: Tiburcio Pea M.D.   On: 03/04/2023 05:43     PROCEDURES:  Critical Care performed: No  Procedures   MEDICATIONS ORDERED IN ED: Medications  droperidol (INAPSINE) 2.5 MG/ML injection 2.5 mg (2.5 mg Intravenous Given 03/04/23 0449)     IMPRESSION / MDM / ASSESSMENT AND PLAN / ED COURSE  I reviewed the triage vital signs and the nursing notes.                             27 year old female presenting with headache x 1 day.  Obtain CT head.  Will trial IM droperidol and reassess.  Patient's presentation is most consistent with acute complicated illness / injury requiring diagnostic workup.   Clinical Course as of 03/04/23 0655  Sat Mar 04, 2023  [id number] Patient sleeping soundly.  Awaiting results of CT scan. [JS]  [id number] Patient remained sleeping.  Awakens but goes immediately back to sleep.  Anticipate patient will be able to be discharged home with prescription for Fioricet when she is awake.  Care transferred to the oncoming provider at change of shift. [JS]    Clinical Course User Index [JS] Irean Hong, MD     FINAL CLINICAL IMPRESSION(S) / ED DIAGNOSES   Final diagnoses:  Acute nonintractable headache, unspecified headache type     Rx / DC Orders   ED Discharge Orders          Ordered    butalbital-acetaminophen-caffeine  (FIORICET) 50-325-40 MG tablet  Every 4 hours PRN        03/04/23 0615             Note:  This document was prepared using Dragon voice recognition software and may include unintentional dictation errors.   Irean Hong, MD 03/04/23 [phone]

## 2023-03-04 NOTE — ED Notes (Signed)
The pt is laying in a left sided recumbent position in the bed asleep.
